# Patient Record
Sex: Female | Born: 1970
Health system: Southern US, Community
[De-identification: ages and names within clinical notes are randomized; demographics above are authoritative.]

## PROBLEM LIST (undated history)

## (undated) DIAGNOSIS — N939 Abnormal uterine and vaginal bleeding, unspecified: Secondary | ICD-10-CM

## (undated) DIAGNOSIS — G43109 Migraine with aura, not intractable, without status migrainosus: Secondary | ICD-10-CM

## (undated) DIAGNOSIS — D649 Anemia, unspecified: Secondary | ICD-10-CM

## (undated) DIAGNOSIS — E349 Endocrine disorder, unspecified: Secondary | ICD-10-CM

## (undated) DIAGNOSIS — F419 Anxiety disorder, unspecified: Secondary | ICD-10-CM

## (undated) DIAGNOSIS — K219 Gastro-esophageal reflux disease without esophagitis: Secondary | ICD-10-CM

## (undated) HISTORY — PX: TUBAL LIGATION: SHX77

## (undated) HISTORY — DX: Abnormal uterine and vaginal bleeding, unspecified: N93.9

## (undated) HISTORY — DX: Migraine with aura, not intractable, without status migrainosus: G43.109

## (undated) HISTORY — PX: OTHER SURGICAL HISTORY: SHX169

## (undated) HISTORY — DX: Anxiety disorder, unspecified: F41.9

## (undated) HISTORY — DX: Endocrine disorder, unspecified: E34.9

## (undated) HISTORY — PX: ABDOMINAL HYSTERECTOMY: SHX81

## (undated) HISTORY — DX: Anemia, unspecified: D64.9

---

## 2000-04-12 ENCOUNTER — Inpatient Hospital Stay (HOSPITAL_COMMUNITY): Admission: AD | Admit: 2000-04-12 | Discharge: 2000-04-12 | Payer: Self-pay | Admitting: *Deleted

## 2000-04-13 ENCOUNTER — Inpatient Hospital Stay (HOSPITAL_COMMUNITY): Admission: AD | Admit: 2000-04-13 | Discharge: 2000-04-13 | Payer: Self-pay | Admitting: Obstetrics

## 2003-07-15 ENCOUNTER — Emergency Department (HOSPITAL_COMMUNITY): Admission: EM | Admit: 2003-07-15 | Discharge: 2003-07-15 | Payer: Self-pay | Admitting: Emergency Medicine

## 2005-06-29 ENCOUNTER — Other Ambulatory Visit: Admission: RE | Admit: 2005-06-29 | Discharge: 2005-06-29 | Payer: Self-pay | Admitting: Obstetrics and Gynecology

## 2006-05-09 ENCOUNTER — Emergency Department (HOSPITAL_COMMUNITY): Admission: EM | Admit: 2006-05-09 | Discharge: 2006-05-09 | Payer: Self-pay | Admitting: Family Medicine

## 2010-07-21 ENCOUNTER — Emergency Department (HOSPITAL_COMMUNITY)
Admission: EM | Admit: 2010-07-21 | Discharge: 2010-07-21 | Payer: Self-pay | Source: Home / Self Care | Admitting: Emergency Medicine

## 2011-03-31 ENCOUNTER — Encounter (HOSPITAL_COMMUNITY): Payer: Self-pay

## 2011-03-31 ENCOUNTER — Encounter (HOSPITAL_COMMUNITY)
Admission: RE | Admit: 2011-03-31 | Discharge: 2011-03-31 | Disposition: A | Discharge status: Home / Self Care | Payer: Managed Care, Other (non HMO) | Source: Ambulatory Visit | Attending: Obstetrics and Gynecology | Admitting: Obstetrics and Gynecology

## 2011-03-31 HISTORY — DX: Gastro-esophageal reflux disease without esophagitis: K21.9

## 2011-03-31 LAB — CBC
HCT: 31 % — ABNORMAL LOW (ref 36.0–46.0)
MCV: 80.9 fL (ref 78.0–100.0)
RBC: 3.83 MIL/uL — ABNORMAL LOW (ref 3.87–5.11)
RDW: 15.9 % — ABNORMAL HIGH (ref 11.5–15.5)
WBC: 6 10*3/uL (ref 4.0–10.5)

## 2011-03-31 NOTE — Patient Instructions (Signed)
   Your procedure is scheduled on:Wed. 04/07/11  Enter through the Main Entrance of [facility] at:7am Pick up the phone at the desk and dial [phone] and inform us of your arrival  Please call this number if you have any problems the morning of surgery: [phone]  Remember: Do not eat food after midnight  Do not drink clear liquids after:midnight Take these medicines the morning of surgery with a SIP OF WATER:none  Do not wear jewelry, make-up, or FINGER nail polish Do not wear lotions, powders, or perfumes.  You may wear deodorant. Do not shave 48 hours prior to surgery. Do not bring valuables to the hospital. Leave suitcase in the car. After Surgery it may be brought to your room. For patients being admitted to the hospital, checkout time is 11:00am the day of discharge.  Patients discharged on the day of surgery will not be allowed to drive home.   Name and phone number of your driver: Annette Stable- [phone]   Remember to use your hibiclens as instructed.Please shower with 1/2 bottle the evening before your surgery and the other 1/2 bottle the morning of surgery.

## 2011-04-06 DIAGNOSIS — N926 Irregular menstruation, unspecified: Secondary | ICD-10-CM

## 2011-04-06 HISTORY — DX: Irregular menstruation, unspecified: N92.6

## 2011-04-06 NOTE — H&P (Signed)
Tammy Colon is an 40 y.o. female [phone]  With irr VB, on [place] fibroid uterus, question of polyp and submucosal fibroid.  Also with ASCUS + HR HPV pap, s/p colpo - benign.  D/w pt r/b/a of hysteroscopy, D&C.  Pertinent Gynecological History: Menstrual History: Pt with h/o abn pap, ASCUS HR HPV, benign colpo Also h/o BTL No h/o STDs, pt with multiple partners    Past Medical History  Diagnosis Date  . Sleep apnea     no sleep study done but does snore  . GERD (gastroesophageal reflux disease)     no meds  . Headache     Past Surgical History  Procedure Date  . Tubal ligation   D&E  Family history of Breast Cancer, Diabetes, and LUpus  Social History:  reports that she has been smoking Cigars.  She does not have any smokeless tobacco history on file. She reports that she drinks alcohol. She reports that she does not use illicit drugs. married  Allergies: No Known Allergies  Meds ibuprofen prn   Review of Systems  Constitutional: Negative.   HENT: Negative.   Eyes: Negative.   Respiratory: Negative.   Cardiovascular: Negative.   Gastrointestinal: Negative.   Genitourinary: Negative.   Musculoskeletal: Negative.   Skin: Negative.   Neurological: Negative.   Endo/Heme/Allergies: Negative.   Psychiatric/Behavioral: Negative.     AFVSS Physical Exam  Constitutional: She is oriented to person, place, and time. She appears well-developed and well-nourished.  HENT:  Head: Normocephalic and atraumatic.  Neck: Normal range of motion. Neck supple. No thyromegaly present.  Cardiovascular: Normal rate and regular rhythm.   Respiratory: Effort normal and breath sounds normal. No respiratory distress.  GI: Soft. Bowel sounds are normal. There is no tenderness.  Musculoskeletal: Normal range of motion.  Neurological: She is alert and oriented to person, place, and time.  Psychiatric: She has a normal mood and affect. Her behavior is normal.      Assessment/Plan: [name] with irregular menses, fibroids and ? Polyp seen on [place].  D/w pt r/b/a of surgery wishes to proceed. BOVARD,Arine Foley 04/06/2011, 5:35 PM

## 2011-04-07 ENCOUNTER — Encounter (HOSPITAL_COMMUNITY): Payer: Self-pay | Admitting: Anesthesiology

## 2011-04-07 ENCOUNTER — Encounter (HOSPITAL_COMMUNITY)
Admission: RE | Disposition: A | Discharge status: Home / Self Care | Payer: Self-pay | Source: Ambulatory Visit | Attending: Obstetrics and Gynecology

## 2011-04-07 ENCOUNTER — Ambulatory Visit (HOSPITAL_COMMUNITY)
Admission: RE | Admit: 2011-04-07 | Discharge: 2011-04-07 | Disposition: A | Discharge status: Home / Self Care | Payer: Managed Care, Other (non HMO) | Source: Ambulatory Visit | Attending: Obstetrics and Gynecology | Admitting: Obstetrics and Gynecology

## 2011-04-07 ENCOUNTER — Other Ambulatory Visit: Payer: Self-pay | Admitting: Obstetrics and Gynecology

## 2011-04-07 ENCOUNTER — Ambulatory Visit (HOSPITAL_COMMUNITY): Payer: Managed Care, Other (non HMO) | Admitting: Anesthesiology

## 2011-04-07 DIAGNOSIS — Z01818 Encounter for other preprocedural examination: Secondary | ICD-10-CM | POA: Insufficient documentation

## 2011-04-07 DIAGNOSIS — Z01812 Encounter for preprocedural laboratory examination: Secondary | ICD-10-CM | POA: Insufficient documentation

## 2011-04-07 DIAGNOSIS — N926 Irregular menstruation, unspecified: Secondary | ICD-10-CM

## 2011-04-07 DIAGNOSIS — D250 Submucous leiomyoma of uterus: Secondary | ICD-10-CM | POA: Insufficient documentation

## 2011-04-07 DIAGNOSIS — N840 Polyp of corpus uteri: Secondary | ICD-10-CM | POA: Insufficient documentation

## 2011-04-07 SURGERY — DILATATION & CURETTAGE/HYSTEROSCOPY WITH RESECTOCOPE
Anesthesia: General | Site: Vagina | Wound class: Clean Contaminated

## 2011-04-07 MED ORDER — LIDOCAINE HCL (CARDIAC) 20 MG/ML IV SOLN
INTRAVENOUS | Status: DC | PRN
  Administered 2011-04-07: 60 mg via INTRAVENOUS

## 2011-04-07 MED ORDER — FENTANYL CITRATE 0.05 MG/ML IJ SOLN
INTRAMUSCULAR | Status: AC
  Filled 2011-04-07: qty 2

## 2011-04-07 MED ORDER — PROPOFOL 10 MG/ML IV EMUL
INTRAVENOUS | Status: DC | PRN
  Administered 2011-04-07: 200 mg via INTRAVENOUS

## 2011-04-07 MED ORDER — IBUPROFEN 200 MG PO TABS: 800.0000 mg | ORAL_TABLET | Freq: Three times a day (TID) | ORAL | Status: DC | PRN

## 2011-04-07 MED ORDER — HYDROMORPHONE HCL 1 MG/ML IJ SOLN
INTRAMUSCULAR | Status: DC | PRN
  Administered 2011-04-07 (×2): 0.5 mg via INTRAVENOUS

## 2011-04-07 MED ORDER — PROPOFOL 10 MG/ML IV EMUL
INTRAVENOUS | Status: AC
  Filled 2011-04-07: qty 20

## 2011-04-07 MED ORDER — HYDROMORPHONE HCL 1 MG/ML IJ SOLN
INTRAMUSCULAR | Status: AC
  Filled 2011-04-07: qty 1

## 2011-04-07 MED ORDER — MIDAZOLAM HCL 5 MG/5ML IJ SOLN
INTRAMUSCULAR | Status: DC | PRN
  Administered 2011-04-07: 2 mg via INTRAVENOUS

## 2011-04-07 MED ORDER — LACTATED RINGERS IV SOLN
INTRAVENOUS | Status: DC
  Administered 2011-04-07 (×2): via INTRAVENOUS

## 2011-04-07 MED ORDER — CEFAZOLIN SODIUM 1-5 GM-% IV SOLN
1.0000 g | INTRAVENOUS | Status: AC
  Administered 2011-04-07: 1 g via INTRAVENOUS

## 2011-04-07 MED ORDER — MIDAZOLAM HCL 2 MG/2ML IJ SOLN
INTRAMUSCULAR | Status: AC
  Filled 2011-04-07: qty 2

## 2011-04-07 MED ORDER — DEXAMETHASONE SODIUM PHOSPHATE 10 MG/ML IJ SOLN
INTRAMUSCULAR | Status: AC
  Filled 2011-04-07: qty 1

## 2011-04-07 MED ORDER — KETOROLAC TROMETHAMINE 60 MG/2ML IM SOLN
INTRAMUSCULAR | Status: DC | PRN
  Administered 2011-04-07: 30 mg via INTRAMUSCULAR

## 2011-04-07 MED ORDER — LIDOCAINE HCL 1 % IJ SOLN
INTRAMUSCULAR | Status: DC | PRN
  Administered 2011-04-07: 17 mL

## 2011-04-07 MED ORDER — GLYCOPYRROLATE 0.2 MG/ML IJ SOLN
INTRAMUSCULAR | Status: DC | PRN
  Administered 2011-04-07: 0.1 mg via INTRAVENOUS

## 2011-04-07 MED ORDER — ONDANSETRON HCL 4 MG/2ML IJ SOLN
INTRAMUSCULAR | Status: DC | PRN
  Administered 2011-04-07: 4 mg via INTRAVENOUS

## 2011-04-07 MED ORDER — CEFAZOLIN SODIUM 1-5 GM-% IV SOLN
INTRAVENOUS | Status: AC
  Filled 2011-04-07: qty 50

## 2011-04-07 MED ORDER — FENTANYL CITRATE 0.05 MG/ML IJ SOLN: 25.0000 ug | INTRAMUSCULAR | Status: DC | PRN

## 2011-04-07 MED ORDER — KETOROLAC TROMETHAMINE 30 MG/ML IJ SOLN
INTRAMUSCULAR | Status: DC | PRN
  Administered 2011-04-07: 30 mg via INTRAVENOUS

## 2011-04-07 MED ORDER — GLYCOPYRROLATE 0.2 MG/ML IJ SOLN
INTRAMUSCULAR | Status: AC
  Filled 2011-04-07: qty 1

## 2011-04-07 MED ORDER — GLYCINE 1.5 % IR SOLN
Status: DC | PRN
  Administered 2011-04-07: 3000 mL

## 2011-04-07 MED ORDER — HYDROCODONE-ACETAMINOPHEN 5-500 MG PO TABS: 1.0000 | ORAL_TABLET | Freq: Four times a day (QID) | ORAL | Status: AC | PRN

## 2011-04-07 MED ORDER — ONDANSETRON HCL 4 MG/2ML IJ SOLN
INTRAMUSCULAR | Status: AC
  Filled 2011-04-07: qty 2

## 2011-04-07 MED ORDER — DEXAMETHASONE SODIUM PHOSPHATE 4 MG/ML IJ SOLN
INTRAMUSCULAR | Status: DC | PRN
  Administered 2011-04-07: 10 mg via INTRAVENOUS

## 2011-04-07 MED ORDER — FENTANYL CITRATE 0.05 MG/ML IJ SOLN
INTRAMUSCULAR | Status: DC | PRN
  Administered 2011-04-07: 100 ug via INTRAVENOUS

## 2011-04-07 MED ORDER — LIDOCAINE HCL (CARDIAC) 20 MG/ML IV SOLN
INTRAVENOUS | Status: AC
  Filled 2011-04-07: qty 5

## 2011-04-07 MED ORDER — KETOROLAC TROMETHAMINE 60 MG/2ML IM SOLN
INTRAMUSCULAR | Status: AC
  Filled 2011-04-07: qty 2

## 2011-04-07 SURGICAL SUPPLY — 15 items
ABLATOR ENDOMETRIAL BIPOLAR (ABLATOR) IMPLANT
CANISTER SUCTION 2500CC (MISCELLANEOUS) ×2 IMPLANT
CATH ROBINSON RED A/P 16FR (CATHETERS) ×2 IMPLANT
CLOTH BEACON ORANGE TIMEOUT ST (SAFETY) ×2 IMPLANT
CONTAINER PREFILL 10% NBF 60ML (FORM) ×4 IMPLANT
DRAPE UTILITY XL STRL (DRAPES) ×4 IMPLANT
ELECT REM PT RETURN 9FT ADLT (ELECTROSURGICAL) ×2
ELECTRODE REM PT RTRN 9FT ADLT (ELECTROSURGICAL) ×1 IMPLANT
GLOVE BIO SURGEON STRL SZ 6.5 (GLOVE) ×2 IMPLANT
GLOVE BIO SURGEON STRL SZ7 (GLOVE) ×2 IMPLANT
GOWN PREVENTION PLUS LG XLONG (DISPOSABLE) ×4 IMPLANT
LOOP ANGLED CUTTING 22FR (CUTTING LOOP) IMPLANT
PACK HYSTEROSCOPY LF (CUSTOM PROCEDURE TRAY) ×2 IMPLANT
TOWEL OR 17X24 6PK STRL BLUE (TOWEL DISPOSABLE) ×4 IMPLANT
WATER STERILE IRR 1000ML POUR (IV SOLUTION) ×2 IMPLANT

## 2011-04-07 NOTE — Anesthesia Procedure Notes (Signed)
Procedure Name: LMA Insertion Date/Time: 04/07/2011 8:46 AM Performed by: Karleen Dolphin Pre-anesthesia Checklist: Patient identified, Timeout performed, Emergency Drugs available, Suction available and Patient being monitored Patient Re-evaluated:Patient Re-evaluated prior to inductionOxygen Delivery Method: Circle System Utilized Preoxygenation: Pre-oxygenation with 100% oxygen Intubation Type: IV induction Ventilation: Mask ventilation without difficulty LMA: LMA inserted LMA Size: 4.0 Number of attempts: 1 Placement Confirmation: positive ETCO2 and breath sounds checked- equal and bilateral Dental Injury: Teeth and Oropharynx as per pre-operative assessment

## 2011-04-07 NOTE — Anesthesia Preprocedure Evaluation (Addendum)
Anesthesia Evaluation  Name, MR# and DOB Patient awake  General Assessment Comment  Reviewed: Allergy & Precautions, H&P , NPO status , Patient's Chart, lab work & pertinent test results  Airway Mallampati: I TM Distance: >3 FB Neck ROM: Full    Dental No notable dental hx.    Pulmonary    Pulmonary exam normal       Cardiovascular     Neuro/Psych    GI/Hepatic Neg liver ROS    Endo/Other  Negative Endocrine ROS  Renal/GU negative Renal ROS  Genitourinary negative   Musculoskeletal negative musculoskeletal ROS (+)   Abdominal Normal abdominal exam  (+)   Peds negative pediatric ROS (+)  Hematology negative hematology ROS (+)   Anesthesia Other Findings   Reproductive/Obstetrics negative OB ROS                           Anesthesia Physical Anesthesia Plan  ASA: II  Anesthesia Plan: General   Post-op Pain Management:    Induction: Intravenous  Airway Management Planned: LMA  Additional Equipment:   Intra-op Plan:   Post-operative Plan:   Informed Consent: I have reviewed the patients History and Physical, chart, labs and discussed the procedure including the risks, benefits and alternatives for the proposed anesthesia with the patient or authorized representative who has indicated his/her understanding and acceptance.     Plan Discussed with: CRNA  Anesthesia Plan Comments:         Anesthesia Quick Evaluation

## 2011-04-07 NOTE — Transfer of Care (Signed)
Immediate Anesthesia Transfer of Care Note  Patient: Tammy Colon  Procedure(s) Performed:  DILATATION & CURETTAGE/HYSTEROSCOPY WITH RESECTOSCOPE  Patient Location: PACU  Anesthesia Type: General  Level of Consciousness: awake, alert  and oriented  Airway & Oxygen Therapy: Patient Spontanous Breathing and Patient connected to nasal cannula oxygen  Post-op Assessment: Report given to PACU RN and Post -op Vital signs reviewed and stable  Post vital signs: Reviewed and stable  Complications: No apparent anesthesia complications

## 2011-04-07 NOTE — Anesthesia Postprocedure Evaluation (Signed)
  Anesthesia Post-op Note  Patient: Tammy Colon  Procedure(s) Performed:  DILATATION & CURETTAGE/HYSTEROSCOPY WITH RESECTOSCOPE  Patient is awake and responsive. Pain and nausea are reasonably well controlled. Vital signs are stable and clinically acceptable. Pt will follow up DBP with LMD. Oxygen saturation is clinically acceptable. There are no apparent anesthetic complications at this time. Patient is ready for discharge.

## 2011-04-07 NOTE — Brief Op Note (Signed)
04/07/2011  9:46 AM  PATIENT:  Tammy Colon  40 y.o. female  PRE-OPERATIVE DIAGNOSIS:  polyps, irregular vaginal bleeding  POST-OPERATIVE DIAGNOSIS:  Polyps, submucosal fibroid, irregular vaginal bleeding  PROCEDURE:  Procedure(s): DILATATION AND CURETTAGE (D&C) /HYSTEROSCOPY, polypectomy, myomectomy  SURGEON:  Surgeon(s): Sherron Monday, MD  ANESTHESIA:   local (17cc 1% lidocaine) and general  EBL:  Total I/O In: 1000 [I.V.:1000] Out: 350 [Urine:300; Blood:50]  LOCAL MEDICATIONS USED:  XYLOCAINE 17CC  SPECIMEN:  Source of Specimen:  endometrial currettings, polyps, fibroid  DISPOSITION OF SPECIMEN:  PATHOLOGY  COUNTS:  YES  DICTATION: .Other Dictation: Dictation Number [id number]  PLAN OF CARE: Discharge to home after PACU  PATIENT DISPOSITION:  PACU - hemodynamically stable.   Delay start of Pharmacological VTE agent (>24hrs) due to surgical blood loss or risk of bleeding:  not applicable
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
## 2013-03-06 ENCOUNTER — Ambulatory Visit (HOSPITAL_COMMUNITY): Payer: Managed Care, Other (non HMO) | Admitting: Psychiatry

## 2013-09-19 ENCOUNTER — Encounter (HOSPITAL_BASED_OUTPATIENT_CLINIC_OR_DEPARTMENT_OTHER): Payer: Self-pay | Admitting: Emergency Medicine

## 2013-09-19 ENCOUNTER — Emergency Department (HOSPITAL_BASED_OUTPATIENT_CLINIC_OR_DEPARTMENT_OTHER)
Admission: EM | Admit: 2013-09-19 | Discharge: 2013-09-19 | Disposition: A | Discharge status: Home / Self Care | Payer: Managed Care, Other (non HMO) | Attending: Emergency Medicine | Admitting: Emergency Medicine

## 2013-09-19 DIAGNOSIS — F17200 Nicotine dependence, unspecified, uncomplicated: Secondary | ICD-10-CM | POA: Diagnosis not present

## 2013-09-19 DIAGNOSIS — Y92410 Unspecified street and highway as the place of occurrence of the external cause: Secondary | ICD-10-CM | POA: Insufficient documentation

## 2013-09-19 DIAGNOSIS — S161XXA Strain of muscle, fascia and tendon at neck level, initial encounter: Secondary | ICD-10-CM

## 2013-09-19 DIAGNOSIS — S139XXA Sprain of joints and ligaments of unspecified parts of neck, initial encounter: Secondary | ICD-10-CM | POA: Insufficient documentation

## 2013-09-19 DIAGNOSIS — Z8719 Personal history of other diseases of the digestive system: Secondary | ICD-10-CM | POA: Diagnosis not present

## 2013-09-19 DIAGNOSIS — Y9389 Activity, other specified: Secondary | ICD-10-CM | POA: Diagnosis not present

## 2013-09-19 DIAGNOSIS — S0993XA Unspecified injury of face, initial encounter: Secondary | ICD-10-CM | POA: Diagnosis present

## 2013-09-19 MED ORDER — HYDROCODONE-ACETAMINOPHEN 5-325 MG PO TABS: 2.0000 | ORAL_TABLET | ORAL | Status: DC | PRN

## 2013-09-19 MED ORDER — CYCLOBENZAPRINE HCL 10 MG PO TABS: 10.0000 mg | ORAL_TABLET | Freq: Three times a day (TID) | ORAL | Status: DC | PRN

## 2013-09-19 MED ORDER — IBUPROFEN 800 MG PO TABS: 800.0000 mg | ORAL_TABLET | Freq: Three times a day (TID) | ORAL | Status: DC
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
## 2013-10-31 ENCOUNTER — Other Ambulatory Visit: Payer: Self-pay | Admitting: Obstetrics and Gynecology

## 2013-10-31 DIAGNOSIS — N6089 Other benign mammary dysplasias of unspecified breast: Secondary | ICD-10-CM

## 2013-11-13 ENCOUNTER — Ambulatory Visit
Admission: RE | Admit: 2013-11-13 | Discharge: 2013-11-13 | Disposition: A | Discharge status: Home / Self Care | Payer: Managed Care, Other (non HMO) | Source: Ambulatory Visit | Attending: Obstetrics and Gynecology | Admitting: Obstetrics and Gynecology

## 2013-11-13 ENCOUNTER — Encounter (INDEPENDENT_AMBULATORY_CARE_PROVIDER_SITE_OTHER): Payer: Self-pay

## 2013-11-13 ENCOUNTER — Other Ambulatory Visit: Payer: Self-pay | Admitting: Obstetrics and Gynecology

## 2013-11-13 DIAGNOSIS — N6089 Other benign mammary dysplasias of unspecified breast: Secondary | ICD-10-CM

## 2013-11-13 DIAGNOSIS — N6320 Unspecified lump in the left breast, unspecified quadrant: Secondary | ICD-10-CM

## 2015-05-18 ENCOUNTER — Encounter (HOSPITAL_COMMUNITY): Payer: Self-pay | Admitting: Emergency Medicine

## 2015-05-18 ENCOUNTER — Emergency Department (HOSPITAL_COMMUNITY)
Admission: EM | Admit: 2015-05-18 | Discharge: 2015-05-18 | Disposition: A | Discharge status: Home / Self Care | Payer: Managed Care, Other (non HMO) | Attending: Emergency Medicine | Admitting: Emergency Medicine

## 2015-05-18 DIAGNOSIS — F17210 Nicotine dependence, cigarettes, uncomplicated: Secondary | ICD-10-CM | POA: Insufficient documentation

## 2015-05-18 DIAGNOSIS — R51 Headache: Secondary | ICD-10-CM | POA: Diagnosis present

## 2015-05-18 DIAGNOSIS — Z791 Long term (current) use of non-steroidal anti-inflammatories (NSAID): Secondary | ICD-10-CM | POA: Insufficient documentation

## 2015-05-18 DIAGNOSIS — Z8669 Personal history of other diseases of the nervous system and sense organs: Secondary | ICD-10-CM | POA: Diagnosis not present

## 2015-05-18 DIAGNOSIS — Z8719 Personal history of other diseases of the digestive system: Secondary | ICD-10-CM | POA: Insufficient documentation

## 2015-05-18 DIAGNOSIS — R519 Headache, unspecified: Secondary | ICD-10-CM

## 2015-05-18 LAB — CBC WITH DIFFERENTIAL/PLATELET
BASOS PCT: 1 %
Basophils Absolute: 0 10*3/uL (ref 0.0–0.1)
Eosinophils Absolute: 0.2 10*3/uL (ref 0.0–0.7)
Eosinophils Relative: 4 %
HEMATOCRIT: 31.2 % — AB (ref 36.0–46.0)
HEMOGLOBIN: 9.7 g/dL — AB (ref 12.0–15.0)
LYMPHS ABS: 1.3 10*3/uL (ref 0.7–4.0)
LYMPHS PCT: 30 %
MCH: 27.1 pg (ref 26.0–34.0)
MCHC: 31.1 g/dL (ref 30.0–36.0)
MCV: 87.2 fL (ref 78.0–100.0)
MONOS PCT: 9 %
Monocytes Absolute: 0.4 10*3/uL (ref 0.1–1.0)
NEUTROS ABS: 2.4 10*3/uL (ref 1.7–7.7)
NEUTROS PCT: 56 %
Platelets: 343 10*3/uL (ref 150–400)
RBC: 3.58 MIL/uL — ABNORMAL LOW (ref 3.87–5.11)
RDW: 17.9 % — ABNORMAL HIGH (ref 11.5–15.5)
WBC: 4.2 10*3/uL (ref 4.0–10.5)

## 2015-05-18 LAB — BASIC METABOLIC PANEL
ANION GAP: 7 (ref 5–15)
BUN: 9 mg/dL (ref 6–20)
CALCIUM: 9.4 mg/dL (ref 8.9–10.3)
CO2: 24 mmol/L (ref 22–32)
Chloride: 107 mmol/L (ref 101–111)
Creatinine, Ser: 0.73 mg/dL (ref 0.44–1.00)
Glucose, Bld: 96 mg/dL (ref 65–99)
Potassium: 3.8 mmol/L (ref 3.5–5.1)
Sodium: 138 mmol/L (ref 135–145)

## 2015-05-18 MED ORDER — KETOROLAC TROMETHAMINE 30 MG/ML IJ SOLN
30.0000 mg | Freq: Once | INTRAMUSCULAR | Status: AC
  Administered 2015-05-18: 30 mg via INTRAVENOUS
  Filled 2015-05-18: qty 1

## 2015-05-18 MED ORDER — PROCHLORPERAZINE EDISYLATE 5 MG/ML IJ SOLN
10.0000 mg | Freq: Four times a day (QID) | INTRAMUSCULAR | Status: DC | PRN
  Administered 2015-05-18: 10 mg via INTRAVENOUS
  Filled 2015-05-18: qty 2

## 2015-05-18 MED ORDER — DIPHENHYDRAMINE HCL 50 MG/ML IJ SOLN
25.0000 mg | Freq: Once | INTRAMUSCULAR | Status: AC
  Administered 2015-05-18: 25 mg via INTRAVENOUS
  Filled 2015-05-18: qty 1

## 2015-05-18 MED ORDER — SODIUM CHLORIDE 0.9 % IV BOLUS (SEPSIS)
1000.0000 mL | Freq: Once | INTRAVENOUS | Status: AC
  Administered 2015-05-18: 1000 mL via INTRAVENOUS

## 2015-05-18 NOTE — Discharge Instructions (Signed)
You have been seen today for a headache and hypertension. Follow up with PCP within the next 2 days for diagnosis and chronic management of hypertension. Return to ED should symptoms worsen.   Emergency Department Resource Guide 1) Find a Doctor and Pay Out of Pocket Although you won't have to find out who is covered by your insurance plan, it is a good idea to ask around and get recommendations. You will then need to call the office and see if the doctor you have chosen will accept you as a new patient and what types of options they offer for patients who are self-pay. Some doctors offer discounts or will set up payment plans for their patients who do not have insurance, but you will need to ask so you aren't surprised when you get to your appointment.  2) Contact Your Local Health Department Not all health departments have doctors that can see patients for sick visits, but many do, so it is worth a call to see if yours does. If you don't know where your local health department is, you can check in your phone book. The CDC also has a tool to help you locate your state's health department, and many state websites also have listings of all of their local health departments.  3) Find a [facility] If your illness is not likely to be very severe or complicated, you may want to try a walk in clinic. These are popping up all over the country in pharmacies, drugstores, and shopping centers. They're usually staffed by nurse practitioners or physician assistants that have been trained to treat common illnesses and complaints. They're usually fairly quick and inexpensive. However, if you have serious medical issues or chronic medical problems, these are probably not your best option.  No Primary Care Doctor: - Call Health Connect at  [phone] - they can help you locate a primary care doctor that  accepts your insurance, provides certain services, etc. - Physician Referral Service- [phone]  Chronic  Pain Problems: Organization         Address  Phone   Notes  [facility]  [phone] Patients need to be referred by their primary care doctor.   Medication Assistance: Organization         Address  Phone   Notes  [facility] Medication [facility] [address] [place], [place] [address] [phone] --Must be a resident of [facility] -- Must have NO insurance coverage whatsoever (no Medicaid/ Medicare, etc.) -- The pt. MUST have a primary care doctor that directs their care regularly and follows them in the community   [facility]ist  [phone]   [employer]  [phone]    Agencies that provide inexpensive medical care: Organization         Address  Phone   Notes  [place]  [phone]   [facility]    [phone]   [facility] [place],  [phone]   [place] [address], [place] [phone]   Planned Parenthood    [phone]   [facility]    [phone]   [place] and [place] [address], [place] Phone:  [phone], Fax:  [phone] Hours of Operation:  9 am - 6 pm, M-F.  Also accepts Medicaid/Medicare and self-pay.  [facility]  [place] [employer], [address], [place] Phone: [phone], Fax: [phone]. Hours of Operation:  8:30 am - 5:30 pm, M-F.  Also accepts Medicaid and self-pay.  [facility] [place] [address], [place] Phone: [phone]   [place], [place], [place] [phone], Ext. [phone] Mondays & Thursdays: 7-9 AM.  First 15 patients are seen on a first come, first serve basis.    [place] Providers:  Organization         Address  Phone   Notes  [facility] [address], [place] [phone] Also  accepts self-pay patients.  [facility] [address], [place]  [phone]   [place], [address], [place] [phone]   [facility] [address], [place] [phone]   Lucianne Lei [address], [place]   [phone] Only accepts [place] Access [place] patients after they have their name applied to their card.   Self-Pay (no insurance) in [facility]:  Organization         Address  Phone   Notes  Sickle Cell Patients, [facility] [place] [phone]   [facility] Urgent Care [place] [phone]   [facility] [place]  [place], [place], [place] [phone]   [facility]/Dr. Osei-Bonsu  [address] [place] or [address], [place] [phone] Phone number for both [place] and [place] locations is the same.  Urgent Medical and [facility] [address], [place] [phone]   [facility] [address] [place] or [address] [phone]   [facility] [address] [place] [phone], phone; [phone], fax Sees patients 1st and 3rd Saturday of every month.  Must not qualify for public or private insurance (i.e. Medicaid, Medicare, [place] Health Choice, Veterans' Benefits)  Household income should be no more than 200% of the poverty level The clinic cannot treat you if you are pregnant or think you are pregnant  Sexually transmitted diseases are not treated at the clinic.    Dental Care: Organization         Address  Phone  Notes  [facility] Department of [facility] [place] [phone] Accepts children up to age 37 who are enrolled in [place] or [place]; pregnant  women with a Medicaid card; and children who have applied for Medicaid or [place] Health Choice, but were declined, whose parents can pay a reduced fee at time of service.  [facility] Department of [facility]  [address], [place] [phone] Accepts children up to age 58 who are enrolled in [place] or [place]; pregnant women with a Medicaid card; and children who have applied for Medicaid or [place] Health Choice, but were declined, whose parents can pay a reduced fee at time of service.  [facility] Adult [facility] Access PRO[facility]  [place] [phone] Patients are seen by appointment only. Walk-ins are not accepted. [place] will see patients 22 years of age and older. Monday - Tuesday (8am-5pm) Most Wednesdays (8:30-5pm) $30 per visit, cash only  [place] Adult  Dental Access PROGRAM  [address], [facility] [phone] Patients are seen by appointment only. Walk-ins are not accepted. [place] will see patients 60 years of age and older. One Wednesday Evening (Monthly: Volunteer Based).  $30 per visit, cash only  [place]  [phone] for adults; Children under age 4, call Graduate Pediatric Dentistry at [phone]. Children aged 49-14, please call [phone] to request a pediatric application.  Dental services are provided in all areas of dental care including fillings, crowns and bridges, complete and partial dentures, implants, gum treatment, root canals, and extractions. Preventive care is also provided. Treatment is provided to both adults and children. Patients are selected via a lottery and there is often a waiting list.   [facility] [address] [place]  [phone] [email or web address]   [facility] [address] [place], [place] [phone], Ext. [phone] Second and Fourth Thursday of each month, opens at 6:30 AM; Clinic ends at 9 AM.  Patients are  seen on a first-come first-served basis, and a limited number are seen during each clinic.   [facility]  [address] Hillard Danker [place], [place] [phone]   Eligibility Requirements You must have lived in [place], [place], or [place] counties for at least the last three months.   You cannot be eligible for state or federal sponsored [employer], including [employer], [place], or [employer].   You generally cannot be eligible for healthcare insurance through your employer.    How to apply: Eligibility screenings are held every Tuesday and Wednesday afternoon from 1:00 pm until 4:00 pm. You do not need an appointment for the interview!  [facility] [address] [place], [place]   [place]  [place] Department  [place]  [phone]    Behavioral Health Resources in the Community: Intensive Outpatient Programs Organization         Address  Phone  Notes  [facility] [place]. [address], [place], [place] [phone]   [facility] Outpatient [address], [place], [place]   ADS: Alcohol & Drug Svcs [address], [place], [place]   [place] [address],  [place], [place] or [phone]   Substance Abuse Resources Organization         Address  Phone  Notes  Alcohol and Drug Services  [phone]   [place]  [phone]   [facility] [place]   Chinita Pester  [phone]   Residential & Outpatient Substance Abuse Program  [phone]   Psychological Services Organization         Address  Phone  Notes  [facility] [place]  [place]  [phone]   [place] [address] [place] [phone] or [phone]    Mobile Crisis  Teams Organization         Address  Phone  Notes  [facility], Mobile Crisis Care Unit  [phone]   [facility]  [address] [place], [place]   [place] Levels [address] [place] [phone]    Self-Help/Support Groups Organization         Address  Phone             Notes  Mental  Health Assoc. of [place] - variety of support groups  [place] Call for more information  Narcotics Anonymous (NA), [facility] [address], [employer] [place]  2 meetings at this location   Special educational needs teacher         Address  Phone  Notes  [facility]idential [facility] [place],    [place]  [phone]   [facility]  [address], [place] [address], [place], [place]   [place], [place] [phone] Admissions: 8am-3pm M-F  Incentives Substance [place] [address],    [place], [place] [phone]   [facility] [address] [place], [place], [place]   [facility] [address]  [place], [place]   [facility] - Intensive Outpatient [address], [place], [place]   [facility] [place]  [place], [place] [phone] or [phone]   [facility]idential Treatment Services (RTS) [address] [place], [place] Accepts Medicaid  [facility] [place] [address],  [place] [phone] Substance Abuse/Addiction Treatment   [facility] Organization         Address  Phone  Notes  [facility]  [phone]   Domenic Schwab, PhD [address] [place], [place]   [phone] or [phone]   [place], [place] [phone]   [facility] [address], [place], [place] [phone]  Insurance/Medicaid/sponsorship through [facility] and Families [address] [place]                                    [place], [place] [phone] [place] [address], [place] [phone]    Dr. Adele Schilder  [phone]   [facility] Dept. 1) [address] [place] 2) [place] 3)  [place] [address], [place] [phone]  [phone]   [place] [phone] or [phone] (After Hours)

## 2015-05-18 NOTE — ED Provider Notes (Signed)
CSN: [id number]     Arrival date & time 05/18/15  1815 History   First MD Initiated Contact with Patient 05/18/15 1908     Chief Complaint  Patient presents with  . Headache     (Consider location/radiation/quality/duration/timing/severity/associated sxs/prior Treatment) HPI   Tammy Colon is a 44 y.o. female, with a history of headaches, presenting to the ED with a headache for the last three days. Pt states [facility]. Pt has no known history of hypertension. Headache is 5/10, on right side, sharp, radiating into the right side of her neck. Pt has tried ibuprofen with the last dose of 800 mg at about 1 pm. Pt states this only occasionally helps the pain. Pt adds that she used to drink 1 fifth of gin every day for at least the last 3 years, but stopped drinking altogether about a week ago. Pt denies changes in vision, LOC, dizziness, N/V, chest pain, shortness of breath, epistaxis, or any other pain or complaints.   Past Medical History  Diagnosis Date  . Sleep apnea     no sleep study done but does snore  . GERD (gastroesophageal reflux disease)     no meds  . [id number])    Past Surgical History  Procedure Laterality Date  . Tubal ligation    . Abdominal hysterectomy     History reviewed. No pertinent family history. Social History  Substance Use Topics  . Smoking status: Current Some Day Smoker    Types: Cigars  . Smokeless tobacco: None  . Alcohol Use: Yes     Comment: several drinks per week   OB History    No data available     Review of Systems  Gastrointestinal: Negative for nausea and vomiting.  Neurological: Positive for headaches. Negative for dizziness, syncope, weakness and numbness.  All other systems reviewed and are negative.     Allergies  Review of patient's allergies indicates no known allergies.  Home Medications   Prior to Admission medications   Medication Sig Start Date End Date Taking?  Authorizing Provider  cyclobenzaprine (FLEXERIL) 10 MG tablet Take 1 tablet (10 mg total) by mouth 3 (three) times daily as needed for muscle spasms. 09/19/13   Orpah Greek, MD  HYDROcodone-acetaminophen (NORCO/VICODIN) 5-325 MG per tablet Take 2 tablets by mouth every 4 (four) hours as needed for moderate pain. 09/19/13   Orpah Greek, MD  ibuprofen (ADVIL,MOTRIN) 200 MG tablet Take 4 tablets (800 mg total) by mouth every 8 (eight) hours as needed for pain. For headaches and cramps. 04/07/11   Janyth Contes, MD  ibuprofen (ADVIL,MOTRIN) 800 MG tablet Take 1 tablet (800 mg total) by mouth 3 (three) times daily. 09/19/13   Orpah Greek, MD   BP 155/103 mmHg  Pulse 81  Temp(Src) 98.9 F (37.2 C) (Oral)  Resp 18  SpO2 100%  LMP 05/14/2015 Physical Exam  Constitutional: She is oriented to person, place, and time. She appears well-developed and well-nourished. No distress.  HENT:  Head: Normocephalic and atraumatic.  Eyes: Conjunctivae and EOM are normal. Pupils are equal, round, and reactive to light.  Neck: Normal range of motion. Neck supple.  Cardiovascular: Normal rate, regular rhythm, normal heart sounds and intact distal pulses.   Pulmonary/Chest: Effort normal and breath sounds normal. No respiratory distress.  Abdominal: Soft. Bowel sounds are normal.  Musculoskeletal: Normal range of motion. She exhibits no edema or tenderness.  Lymphadenopathy:  She has no cervical adenopathy.  Neurological: She is alert and oriented to person, place, and time.  No sensory deficits. Strength 5/5 in all extremities. No gait disturbance. Cranial nerves II-XII grossly intact.  Skin: Skin is warm and dry. She is not diaphoretic.  Nursing note and vitals reviewed.   ED Course  Procedures (including critical care time) Labs Review Labs Reviewed  CBC WITH DIFFERENTIAL/PLATELET - Abnormal; Notable for the following:    RBC 3.58 (*)    Hemoglobin 9.7 (*)    HCT  31.2 (*)    RDW 17.9 (*)    All other components within normal limits  BASIC METABOLIC PANEL    Imaging Review No results found. I have personally reviewed and evaluated these images and lab results as part of my medical decision-making.   EKG Interpretation None      MDM   Final diagnoses:  Acute nonintractable headache, unspecified headache type    Cattleya A Colon presents with a headache and concern for hypertension.  Do not suspect hypertensive crisis or other major concern at this time. Patient's headache will be treated and patient discharged with instructions to follow-up with her PCP in the next few days. This plan of care was shared with the patient and she agrees to the plan. Upon reassessment patient states that her headache is gone and she feels completely normal. Patient is comfortable with discharge.    Lorayne Bender, PA-C 05/18/15 2050  Davonna Belling, MD 05/18/15 2328

## 2015-05-18 NOTE — ED Notes (Signed)
Pt c/o headache on R side, [facility] and [facility], no known hx of hypertension. OTC meds did not change headache.

## 2016-08-24 ENCOUNTER — Encounter: Payer: Self-pay | Admitting: Obstetrics and Gynecology

## 2016-08-24 ENCOUNTER — Ambulatory Visit (INDEPENDENT_AMBULATORY_CARE_PROVIDER_SITE_OTHER): Payer: 59 | Admitting: Obstetrics and Gynecology

## 2016-08-24 ENCOUNTER — Telehealth: Payer: Self-pay | Admitting: Obstetrics and Gynecology

## 2016-08-24 VITALS — BP 112/70 | HR 72 | Resp 14 | Ht 64.0 in | Wt 142.0 lb

## 2016-08-24 DIAGNOSIS — R102 Pelvic and perineal pain: Secondary | ICD-10-CM | POA: Diagnosis not present

## 2016-08-24 DIAGNOSIS — N946 Dysmenorrhea, unspecified: Secondary | ICD-10-CM | POA: Diagnosis not present

## 2016-08-24 DIAGNOSIS — Z124 Encounter for screening for malignant neoplasm of cervix: Secondary | ICD-10-CM | POA: Diagnosis not present

## 2016-08-24 DIAGNOSIS — Z113 Encounter for screening for infections with a predominantly sexual mode of transmission: Secondary | ICD-10-CM

## 2016-08-24 DIAGNOSIS — Z01419 Encounter for gynecological examination (general) (routine) without abnormal findings: Secondary | ICD-10-CM | POA: Diagnosis not present

## 2016-08-24 DIAGNOSIS — Z0000 Encounter for general adult medical examination without abnormal findings: Secondary | ICD-10-CM

## 2016-08-24 DIAGNOSIS — N941 Unspecified dyspareunia: Secondary | ICD-10-CM | POA: Diagnosis not present

## 2016-08-24 DIAGNOSIS — Z833 Family history of diabetes mellitus: Secondary | ICD-10-CM | POA: Diagnosis not present

## 2016-08-24 DIAGNOSIS — N920 Excessive and frequent menstruation with regular cycle: Secondary | ICD-10-CM

## 2016-08-24 DIAGNOSIS — Z23 Encounter for immunization: Secondary | ICD-10-CM | POA: Diagnosis not present

## 2016-08-24 LAB — COMPREHENSIVE METABOLIC PANEL WITH GFR
ALT: 8 U/L (ref 6–29)
AST: 12 U/L (ref 10–30)
Albumin: 4.1 g/dL (ref 3.6–5.1)
Alkaline Phosphatase: 52 U/L (ref 33–115)
BUN: 7 mg/dL (ref 7–25)
CO2: 25 mmol/L (ref 20–31)
Calcium: 9.7 mg/dL (ref 8.6–10.2)
Chloride: 103 mmol/L (ref 98–110)
Creat: 0.7 mg/dL (ref 0.50–1.10)
Glucose, Bld: 90 mg/dL (ref 65–99)
Potassium: 4.4 mmol/L (ref 3.5–5.3)
Sodium: 139 mmol/L (ref 135–146)
Total Bilirubin: 0.5 mg/dL (ref 0.2–1.2)
Total Protein: 7.5 g/dL (ref 6.1–8.1)

## 2016-08-24 LAB — CBC WITH DIFFERENTIAL/PLATELET
Basophils Absolute: 65 cells/uL (ref 0–200)
Basophils Relative: 1 %
EOS PCT: 2 %
Eosinophils Absolute: 130 cells/uL (ref 15–500)
HCT: 36.9 % (ref 35.0–45.0)
Hemoglobin: 12.4 g/dL (ref 11.7–15.5)
LYMPHS ABS: 1300 {cells}/uL (ref 850–3900)
Lymphocytes Relative: 20 %
MCH: 30.3 pg (ref 27.0–33.0)
MCHC: 33.6 g/dL (ref 32.0–36.0)
MCV: 90.2 fL (ref 80.0–100.0)
MPV: 9.4 fL (ref 7.5–12.5)
Monocytes Absolute: 715 cells/uL (ref 200–950)
Monocytes Relative: 11 %
NEUTROS ABS: 4290 {cells}/uL (ref 1500–7800)
Neutrophils Relative %: 66 %
Platelets: 408 10*3/uL — ABNORMAL HIGH (ref 140–400)
RBC: 4.09 MIL/uL (ref 3.80–5.10)
RDW: 13.5 % (ref 11.0–15.0)
WBC: 6.5 10*3/uL (ref 3.8–10.8)

## 2016-08-24 LAB — LIPID PANEL
Cholesterol: 200 mg/dL — ABNORMAL HIGH
HDL: 91 mg/dL
LDL Cholesterol: 92 mg/dL
Total CHOL/HDL Ratio: 2.2 ratio
Triglycerides: 85 mg/dL
VLDL: 17 mg/dL

## 2016-08-24 LAB — TSH: TSH: 0.76 m[IU]/L

## 2016-08-24 NOTE — Progress Notes (Addendum)
46 y.o. [id number] MarriedAfrican AmericanF here for annual exam. Patient c/o irregular menstrual cycles and LLQ pain with cycles  Cycles come monthly, q 21 to 28 days. Getting longer, now lasting 7-8 days (up from 3-5 in the last 1-2 years). Saturates a super tampon every 2 hours. Cramps are severe, she takes 800 mg of ibuprofen, can take it 4 hours apart, but only 2 x in a day. Sexually active, recently having some deep dyspareunia on the left side. It is positional. Since Saturday she c/o a constant sharp pain in her LLQ/pelvis. Helped with ASA but doesn't go away. Pain was up to a 10/10 on Saturday. Today 7-8/10, with ASA the pain resolves. Slight nausea, feeling hot flashes. Normal bowel and bladder functions.  She c/o hot flashes and night sweats for years. Up every night with sweats. Sleeps with a fan.  No vaginal dryness.  Period Duration (Days): 7-8 days  Period Pattern: (!) Irregular Menstrual Flow: Heavy Menstrual Control: Maxi pad, Tampon Menstrual Control Change Freq (Hours): changes tampon every 2 hours on heavy days  Dysmenorrhea: (!) Severe Dysmenorrhea Symptoms: Cramping  Patient's last menstrual period was 08/14/2016.          Sexually active: Yes.    The current method of family planning is tubal ligation.    Exercising: No.  The patient does not participate in regular exercise at present. Smoker:  Former   Health Maintenance: Pap:  07/2015 unsure results  History of abnormal Pap:  Yes, had a colposcopy over 7 years ago. No surgery on her cervix.  MMG:  11-13-13 WNL  Colonoscopy:  Never BMD:   Never TDaP:  unsure Gardasil: N/A   reports that she has quit smoking. Her smoking use included Cigars. She has never used smokeless tobacco. She reports that she drinks about 3.0 oz of alcohol per week . She reports that she does not use drugs. She works on [employer]. Kids are 25, 26, and 20. No one is at home.   Past Medical History:  Diagnosis Date  . Abnormal uterine bleeding    . Anemia   . GERD (gastroesophageal reflux disease)    no meds  . Hormone disorder   . Migraine with aura     Past Surgical History:  Procedure Laterality Date  . HYSTEROSCOPY    . TUBAL LIGATION      Current Outpatient Prescriptions  Medication Sig Dispense Refill  . ibuprofen (ADVIL,MOTRIN) 200 MG tablet Take 4 tablets (800 mg total) by mouth every 8 (eight) hours as needed for pain. For headaches and cramps. 40 tablet 1   No current facility-administered medications for this visit.     Family History  Problem Relation Age of Onset  . Thyroid disease Mother   . Lupus Maternal Aunt   . Cancer Maternal Aunt   . Diabetes Daughter   28 year old daughter has diabetes, juvenile   Review of Systems  Constitutional: Negative.   HENT: Negative.   Eyes: Negative.   Respiratory: Negative.   Cardiovascular: Negative.   Gastrointestinal: Negative.   Endocrine: Negative.   Genitourinary: Positive for menstrual problem.       Irregular menstrual cycles Menorrhea LLQ pain with cycles   Musculoskeletal: Negative.   Skin: Negative.   Allergic/Immunologic: Negative.   Neurological: Negative.   Psychiatric/Behavioral: Negative.     Exam:   BP 112/70 (BP Location: Right Arm, Patient Position: Sitting, Cuff Size: Normal)   Pulse 72   Resp 14   Ht 5'  4" (1.626 m)   Wt 142 lb (64.4 kg)   LMP 08/14/2016   BMI 24.37 kg/m   Weight change: @WEIGHTCHANGE @ Height:   Height: 5\' 4"  (162.6 cm)  Ht Readings from Last 3 Encounters:  08/24/16 5\' 4"  (1.626 m)  09/19/13 5\' 4"  (1.626 m)  03/31/11 5\' 3"  (1.6 m)    General appearance: alert, cooperative and appears stated age Head: Normocephalic, without obvious abnormality, atraumatic Neck: no adenopathy, supple, symmetrical, trachea midline and thyroid normal to inspection and palpation Lungs: clear to auscultation bilaterally Cardiovascular: regular rate and rhythm Breasts: normal appearance, no masses or tenderness, left nipple  inverted (long term) Heart: regular rate and rhythm Abdomen: soft, non-tender; bowel sounds normal; no masses,  no organomegaly Extremities: extremities normal, atraumatic, no cyanosis or edema Skin: Skin color, texture, turgor normal. No rashes or lesions Lymph nodes: Cervical, supraclavicular, and axillary nodes normal. No abnormal inguinal nodes palpated Neurologic: Grossly normal   Pelvic: External genitalia:  no lesions              Urethra:  normal appearing urethra with no masses, tenderness or lesions              Bartholins and Skenes: normal                 Vagina: normal appearing vagina with normal color and discharge, no lesions              Cervix: no cervical motion tenderness and no lesions               Bimanual Exam:  Uterus:  uterus anteverted, mobile, tender, most tender toward the right, uterus is enlarged 9 week sized. No adnexal masses, tender on the left.               Adnexa: tender in the left adnexa, no masses.                Rectovaginal: Confirms               Anus:  normal sphincter tone, no lesions  Chaperone was present for exam.  A:  Well Woman with normal exam  Menorrhagia  Dysmenorrhea  LLQ abdominal/pelvic pain  P:   Pap with hpv  Screening labs, TSH, Ferritin  GYN ultrasound, possible sonohysterogram  Not candidate for OCP's, and no help with the ring in the past  Discussed possible use of the Mirena IUD  Mammogram due, #'s given  TDAP  Discussed breast self exam  Discussed calcium and vit D intake  08/26/16: Records reviewed The patient has a h/o GC and Chlamydia H/O hysteroscopic myomectomy  Pap from 2/17 was negative with +HPV  This years pap is negative with negative hpv, she will need a f/u pap and hpv in 3 years.

## 2016-08-24 NOTE — Telephone Encounter (Signed)
Patient wants to know if she is to schedule the mammogram or are we setting it up for her.

## 2016-08-24 NOTE — Patient Instructions (Signed)

## 2016-08-24 NOTE — Telephone Encounter (Signed)
Spoke with patient. Advised patient since she is in need of a screening mammogram she may call and schedule this to ensure she is scheduled for a day and time that work well for her. Offered to assist with scheduling, but patient declines as she would like to check her work schedule before making an appointment.  Routing to provider for final review. Patient agreeable to disposition. Will close encounter.

## 2016-08-25 LAB — STD PANEL
HIV 1&2 Ab, 4th Generation: NONREACTIVE
Hepatitis B Surface Ag: NEGATIVE

## 2016-08-25 LAB — VITAMIN D 25 HYDROXY (VIT D DEFICIENCY, FRACTURES): VIT D 25 HYDROXY: 10 ng/mL — AB (ref 30–100)

## 2016-08-25 LAB — HEPATITIS C ANTIBODY: HCV Ab: NEGATIVE

## 2016-08-25 LAB — HEMOGLOBIN A1C
Hgb A1c MFr Bld: 4.9 % (ref <5.7)
Mean Plasma Glucose: 94 mg/dL

## 2016-08-25 LAB — IPS N GONORRHOEA AND CHLAMYDIA BY PCR

## 2016-08-25 LAB — FERRITIN: Ferritin: 67 ng/mL (ref 10–232)

## 2016-08-26 ENCOUNTER — Encounter: Payer: Self-pay | Admitting: Obstetrics and Gynecology

## 2016-08-26 ENCOUNTER — Telehealth: Payer: Self-pay | Admitting: *Deleted

## 2016-08-26 ENCOUNTER — Ambulatory Visit (INDEPENDENT_AMBULATORY_CARE_PROVIDER_SITE_OTHER): Payer: 59 | Admitting: Obstetrics and Gynecology

## 2016-08-26 ENCOUNTER — Ambulatory Visit (INDEPENDENT_AMBULATORY_CARE_PROVIDER_SITE_OTHER): Payer: 59

## 2016-08-26 ENCOUNTER — Other Ambulatory Visit: Payer: Self-pay | Admitting: Obstetrics and Gynecology

## 2016-08-26 VITALS — BP 108/88 | HR 72 | Resp 16 | Ht 64.0 in | Wt 142.0 lb

## 2016-08-26 DIAGNOSIS — N920 Excessive and frequent menstruation with regular cycle: Secondary | ICD-10-CM

## 2016-08-26 DIAGNOSIS — D252 Subserosal leiomyoma of uterus: Secondary | ICD-10-CM | POA: Diagnosis not present

## 2016-08-26 DIAGNOSIS — N949 Unspecified condition associated with female genital organs and menstrual cycle: Secondary | ICD-10-CM | POA: Diagnosis not present

## 2016-08-26 DIAGNOSIS — R102 Pelvic and perineal pain: Secondary | ICD-10-CM

## 2016-08-26 DIAGNOSIS — N946 Dysmenorrhea, unspecified: Secondary | ICD-10-CM

## 2016-08-26 DIAGNOSIS — D251 Intramural leiomyoma of uterus: Secondary | ICD-10-CM | POA: Diagnosis not present

## 2016-08-26 DIAGNOSIS — Z113 Encounter for screening for infections with a predominantly sexual mode of transmission: Secondary | ICD-10-CM | POA: Diagnosis not present

## 2016-08-26 DIAGNOSIS — D250 Submucous leiomyoma of uterus: Secondary | ICD-10-CM | POA: Diagnosis not present

## 2016-08-26 DIAGNOSIS — R1032 Left lower quadrant pain: Secondary | ICD-10-CM

## 2016-08-26 DIAGNOSIS — N9489 Other specified conditions associated with female genital organs and menstrual cycle: Secondary | ICD-10-CM

## 2016-08-26 LAB — IPS PAP TEST WITH HPV

## 2016-08-26 MED ORDER — OXYCODONE-ACETAMINOPHEN 5-325 MG PO TABS: 1.0000 | ORAL_TABLET | ORAL | 0 refills | Status: DC | PRN

## 2016-08-26 MED ORDER — VITAMIN D (ERGOCALCIFEROL) 1.25 MG (50000 UNIT) PO CAPS: 50000.0000 [IU] | ORAL_CAPSULE | ORAL | 0 refills | Status: DC

## 2016-08-26 MED ORDER — IBUPROFEN 800 MG PO TABS
800.0000 mg | ORAL_TABLET | Freq: Three times a day (TID) | ORAL | 1 refills | Status: DC | PRN
Start: 2016-08-26 — End: 2016-10-18

## 2016-08-26 NOTE — Telephone Encounter (Signed)
Spoke with patient and gave results. Sent in RX for Vitamin D. Patient scheduled got 12 week recheck -eh

## 2016-08-26 NOTE — Progress Notes (Signed)
Spoke with Prentiss Bells at [facility]. CT pelvis with contrast ordered for LLQ pain. Per Prentiss Bells, no available appointments for tomorrow. States if CT scan is precerted prior to arrival, patient will be able to be a walk in between the hours of 0830- 1630. Message given to Dr. Talbert Nan and patient. Patient will drink contrast at [facility]. Will have [place] or [place] call patient once precerted so patient will know when to go.

## 2016-08-26 NOTE — Progress Notes (Signed)
GYNECOLOGY  VISIT   HPI: 46 y.o.   Married  [place] American  female   [phone] with Patient's last menstrual period was 08/14/2016.   here for Pelvic US  For menorrhagia, dysmenorrhea, LLQ abdominal/pelvic pain with her cycles and  bad LLQ abdominal pain since the weekend. Labs from yesterday revealed a normal WBC and differential, negative genprobe and STD screening. Pap with +BV.  She denies fevers, chills. She has had mild constipation since yesterday.  The pain was severe last night, almost went to the ER.  She is here with her fiance Doren Custard. He states he is from [place] wonders if she was checked for trich. He is not aware of having it, but it is prevalent in [place]. She denies a watery d/c.    GYNECOLOGIC HISTORY: Patient's last menstrual period was 08/14/2016. Contraception: Tubal ligation  Menopausal hormone therapy: none        OB History    Gravida Para Term Preterm AB Living   4 3 2 1 1 3    SAB TAB Ectopic Multiple Live Births           3         Patient Active Problem List   Diagnosis Date Noted  . Irregular menses 04/06/2011    Past Medical History:  Diagnosis Date  . Abnormal uterine bleeding   . Anemia   . GERD (gastroesophageal reflux disease)    no meds  . Hormone disorder   . Migraine with aura     Past Surgical History:  Procedure Laterality Date  . HYSTEROSCOPY    . TUBAL LIGATION      Current Outpatient Prescriptions  Medication Sig Dispense Refill  . Vitamin D, Ergocalciferol, (DRISDOL) 50000 units CAPS capsule Take 1 capsule (50,000 Units total) by mouth every 7 (seven) days. 12 capsule 0  . ibuprofen (ADVIL,MOTRIN) 800 MG tablet Take 1 tablet (800 mg total) by mouth every 8 (eight) hours as needed. 30 tablet 1  . oxyCODONE-acetaminophen (PERCOCET) 5-325 MG tablet Take 1-2 tablets by mouth every 4 (four) hours as needed. use only as much as needed to relieve pain 20 tablet 0   No current facility-administered medications for this visit.       ALLERGIES: Patient has no known allergies.  Family History  Problem Relation Age of Onset  . Thyroid disease Mother   . Lupus Maternal Aunt   . Cancer Maternal Aunt   . Diabetes Daughter     Social History   Social History  . Marital status: Married    Spouse name: N/A  . Number of children: N/A  . Years of education: N/A   Occupational History  . Not on file.   Social History Main Topics  . Smoking status: Former Smoker    Types: Cigars  . Smokeless tobacco: Never Used  . Alcohol use 3.0 oz/week    5 Glasses of wine per week  . Drug use: No  . Sexual activity: Yes    Partners: Male    Birth control/ protection: Surgical   Other Topics Concern  . Not on file   Social History Narrative  . No narrative on file    Review of Systems  Constitutional: Negative.   HENT: Negative.   Eyes: Negative.   Respiratory: Negative.   Cardiovascular: Negative.   Gastrointestinal: Negative.   Genitourinary: Negative.   Musculoskeletal: Negative.   Skin: Negative.   Neurological: Negative.   Endo/Heme/Allergies: Negative.   Psychiatric/Behavioral: Negative.  PHYSICAL EXAMINATION:    BP 108/88 (BP Location: Right Arm, Patient Position: Sitting, Cuff Size: Large)   Pulse 72   Resp 16   Ht 5\' 4"  (1.626 m)   Wt 142 lb (64.4 kg)   LMP 08/14/2016   BMI 24.37 kg/m     General appearance: alert, cooperative and appears stated age  Abdomen: soft, very tender in the LLQ, no rebound, no guarding, mildly distended; no masses,  no organomegaly  Pelvic: External genitalia:  no lesions              Urethra:  normal appearing urethra with no masses, tenderness or lesions              Bartholins and Skenes: normal                 Cervix: no cervical motion tenderness              Bimanual Exam:  Uterus:  uterus anteverted, 9 week sized, tender, mobile              Adnexa: no masses, tender in the left adnexa   Ultrasound images reviewed with the patient and her fiance.  She has an anteverted uterus with several small myomas, including a submucosal myoma. The right ovary appeared normal. Next to the right ovary is a 2 cm mass (?tube). The left ovary with mass measures 5.2 x 3.2 x 3.4 cm, can't separate the left ovary from the mass. It has solid and cystic spaces, avascular, ? Tubular. Can't r/o abscess.                 ASSESSMENT -Menorrhagia, dysmenorrhea, she has a fibroid uterus with a submucosal myoma -LLQ abdominal/pelvic pain with her cycle, worsening pain in the last 5 days, she has an abnormal appearance to the left adnexa, concerning for possible abscess. She doesn't have an elevated WBC, cervical motion tenderness or an acute abdomen on exam. Negative genprobe yesterday. She has also had a tubal ligation which should decrease her risk of PID.  -Screening for STD's, negative from yesterday. Will send off a wet prep probe to r/o trich -BV on pap, she is starting Flagyl    PLAN -UPT negative -Will set her up for a pelvic CT tomorrow morning -Will send her home with ibuprofen and percocet -She will call with worsening pain, watch for fever, aware to go to the ER overnight if needed -Discussed the need to figure out what is going on in her left adnexal region prior to making further plans -She could have a minimum of a hysteroscopic myomectomy, but would prefer hysterectomy. Will further discuss at her f/u visit    An After Visit Summary was printed and given to the patient.  25 minutes face to face time of which over 50% was spent in counseling.

## 2016-08-26 NOTE — Telephone Encounter (Signed)
-----   Message from Salvadore Dom, MD sent at 08/25/2016  5:15 PM EST ----- Please inform the patient that her vit d level was very low and call in 50,000 IU of vit D3 weekly for 12 weeks, then she needs to come in for another vit d level.  Her pap is pending, but the rest of her lab work was normal.

## 2016-08-27 ENCOUNTER — Telehealth: Payer: Self-pay | Admitting: *Deleted

## 2016-08-27 ENCOUNTER — Ambulatory Visit
Admission: RE | Admit: 2016-08-27 | Discharge: 2016-08-27 | Disposition: A | Discharge status: Home / Self Care | Payer: 59 | Source: Ambulatory Visit | Attending: Obstetrics and Gynecology | Admitting: Obstetrics and Gynecology

## 2016-08-27 DIAGNOSIS — R1032 Left lower quadrant pain: Secondary | ICD-10-CM

## 2016-08-27 LAB — WET PREP BY MOLECULAR PROBE
Candida species: NOT DETECTED
GARDNERELLA VAGINALIS: DETECTED — AB
TRICHOMONAS VAG: NOT DETECTED

## 2016-08-27 MED ORDER — IOPAMIDOL (ISOVUE-300) INJECTION 61%
100.0000 mL | Freq: Once | INTRAVENOUS | Status: AC | PRN
  Administered 2016-08-27: 100 mL via INTRAVENOUS

## 2016-08-27 MED ORDER — METRONIDAZOLE 500 MG PO TABS: 500.0000 mg | ORAL_TABLET | Freq: Two times a day (BID) | ORAL | 0 refills | Status: DC

## 2016-08-27 NOTE — Telephone Encounter (Signed)
Spoke with patient. Advised patient precert done for CT, f/u with [facility], provided reference # [id number]. Patient asking is any money will be collected at [facility] for CT, advised pateint to call [facility] directly for payment questions, patient is agreeable. Patient states she was supposed to have 4 prescriptions from Dr. Talbert Nan on 3/2, but only received 3. Patient states she was to get RX for BV, prefers gel. Advised patient will confirm ok to send Metrogel to pharmacy and return call, patient is agreeable.   Dr. Talbert Nan, ok to send Metrogel to pharmacy?  Cc: Deloris Ping

## 2016-08-27 NOTE — Telephone Encounter (Signed)
Spoke with Diane at [facility], was advised to call report on CT of pelvis. Report available for review in EPIC. Advised will review with provider.   Dr. Talbert Nan, please review CT dated today and advise.

## 2016-08-27 NOTE — Telephone Encounter (Signed)
Spoke with patient. Advised of message and results as seen below from [place]. Patient is agreeable and verbalizes understanding. Rx for Flagyl 500 mg BID x 7 days sent to pharmacy on file. Follow up appointment scheduled for 08/31/2016 at 4:30 pm as patient is only able to come late in the afternoon with work.  Notes Recorded by Salvadore Dom, MD on 08/27/2016 at 11:39 AM EST Please let the patient know that her wet prep probe only returned + for BV. Her CT scan did not show an abscess in her pelvis. Please schedule her to come back for f/u early next week and we can discuss further plans.  Routing to provider for final review. Patient agreeable to disposition. Will close encounter.

## 2016-08-27 NOTE — Telephone Encounter (Signed)
See first telephone encounter dated 08/27/16, will close encounter.

## 2016-08-27 NOTE — Telephone Encounter (Signed)
Given her discomfort, I think the oral flagyl would be better for her. 500 mg po BID x 7 days.

## 2016-08-31 ENCOUNTER — Ambulatory Visit (INDEPENDENT_AMBULATORY_CARE_PROVIDER_SITE_OTHER): Payer: 59 | Admitting: Obstetrics and Gynecology

## 2016-08-31 ENCOUNTER — Encounter: Payer: Self-pay | Admitting: Obstetrics and Gynecology

## 2016-08-31 VITALS — BP 122/70 | HR 84 | Resp 14 | Wt 142.0 lb

## 2016-08-31 DIAGNOSIS — D252 Subserosal leiomyoma of uterus: Secondary | ICD-10-CM | POA: Diagnosis not present

## 2016-08-31 DIAGNOSIS — N920 Excessive and frequent menstruation with regular cycle: Secondary | ICD-10-CM

## 2016-08-31 DIAGNOSIS — D251 Intramural leiomyoma of uterus: Secondary | ICD-10-CM | POA: Diagnosis not present

## 2016-08-31 DIAGNOSIS — N946 Dysmenorrhea, unspecified: Secondary | ICD-10-CM | POA: Diagnosis not present

## 2016-08-31 DIAGNOSIS — D250 Submucous leiomyoma of uterus: Secondary | ICD-10-CM

## 2016-08-31 NOTE — Progress Notes (Signed)
GYNECOLOGY  VISIT   HPI: 46 y.o.   Married  [place] American  female   [phone] with Patient's last menstrual period was 08/14/2016.   here for follow up LLQ pain    GYNECOLOGIC HISTORY: Patient's last menstrual period was 08/14/2016. Contraception:tubal ligation Menopausal hormone therapy: none         OB History    Gravida Para Term Preterm AB Living   4 3 2 1 1 3    SAB TAB Ectopic Multiple Live Births           3         Patient Active Problem List   Diagnosis Date Noted  . Irregular menses 04/06/2011    Past Medical History:  Diagnosis Date  . Abnormal uterine bleeding   . Anemia   . GERD (gastroesophageal reflux disease)    no meds  . Hormone disorder   . Migraine with aura     Past Surgical History:  Procedure Laterality Date  . hysteroscopic myomectomy    . TUBAL LIGATION      Current Outpatient Prescriptions  Medication Sig Dispense Refill  . ibuprofen (ADVIL,MOTRIN) 800 MG tablet Take 1 tablet (800 mg total) by mouth every 8 (eight) hours as needed. 30 tablet 1  . metroNIDAZOLE (FLAGYL) 500 MG tablet Take 1 tablet (500 mg total) by mouth 2 (two) times daily. 14 tablet 0  . oxyCODONE-acetaminophen (PERCOCET) 5-325 MG tablet Take 1-2 tablets by mouth every 4 (four) hours as needed. use only as much as needed to relieve pain 20 tablet 0  . Vitamin D, Ergocalciferol, (DRISDOL) 50000 units CAPS capsule Take 1 capsule (50,000 Units total) by mouth every 7 (seven) days. 12 capsule 0   No current facility-administered medications for this visit.      ALLERGIES: Patient has no known allergies.  Family History  Problem Relation Age of Onset  . Thyroid disease Mother   . Lupus Maternal Aunt   . Cancer Maternal Aunt   . Diabetes Daughter     Social History   Social History  . Marital status: Married    Spouse name: N/A  . Number of children: N/A  . Years of education: N/A   Occupational History  . Not on file.   Social History Main Topics  .  Smoking status: Former Smoker    Types: Cigars  . Smokeless tobacco: Never Used  . Alcohol use 3.0 oz/week    5 Glasses of wine per week  . Drug use: No  . Sexual activity: Yes    Partners: Male    Birth control/ protection: Surgical   Other Topics Concern  . Not on file   Social History Narrative  . No narrative on file    Review of Systems  Constitutional: Negative.   HENT: Negative.   Eyes: Negative.   Respiratory: Negative.   Cardiovascular: Negative.   Gastrointestinal: Negative.   Genitourinary:       LLQ pain  Musculoskeletal: Negative.   Skin: Negative.   Neurological: Negative.   Endo/Heme/Allergies: Negative.   Psychiatric/Behavioral: Negative.     PHYSICAL EXAMINATION:    Wt 142 lb (64.4 kg)   LMP 08/14/2016   BMI 24.37 kg/m     General appearance: alert, cooperative and appears stated age   Pelvic: External genitalia:  no lesions              Urethra:  normal appearing urethra with no masses, tenderness or lesions  Bartholins and Skenes: normal                 Vagina: normal appearing vagina with normal color and discharge, no lesions              Cervix: no lesions                The risks of endometrial biopsy were reviewed and a consent was obtained.  A speculum was placed in the vagina and the cervix was cleansed with betadine. A tenaculum was placed on the cervix and the mini-pipelle was placed into the endometrial cavity. The uterus sounded to 9 cm. The endometrial biopsy was performed, moderate tissue was obtained. The tenaculum and speculum were removed. There were no complications.   Chaperone was present for exam.  ASSESSMENT Menorrhagia Dysmenorrhea Fibroid uterus, including a submucous myoma LLQ abdominal pain, improved today, limited ultrasound adnexa improved     PLAN Reviewed options of hysteroscopic myomectomy, contraception, IUD's or hysterectomy She desires hysterectomy Discussed total laparoscopic  hysterectomy, bilateral salpingectomy and cystoscopy. Reviewed the risks of the procedure, including infection, bleeding, damage to bowel/badder/vessels/ureters.  Discussed the possible need for laparotomy. Discussed post operative recovery and risk of cuff dehiscence. All of her questions were answered    An After Visit Summary was printed and given to the patient.  20 minutes face to face time of which over 50% was spent in counseling.

## 2016-09-02 ENCOUNTER — Telehealth: Payer: Self-pay | Admitting: Obstetrics and Gynecology

## 2016-09-02 NOTE — Telephone Encounter (Signed)
Attempted to reach patient at number provided [phone], recording states the voicemail box you have reached has not been set up yet.   Need to advise patient results have not returned yet and she will be contacted as soon as they are available.

## 2016-09-02 NOTE — Telephone Encounter (Signed)
Patient called requesting her recent biopsy results. She said, "I may need surgery but I need to know what my results are first." Patient aware results may not be ready for her yet.

## 2016-09-02 NOTE — Telephone Encounter (Signed)
Spoke with patient. Advised results from [facility] have not returned yet and that this is not uncommon as they take longer to return. Advised as soon as results have returned Dr.Jertson will review these and our office will be in contact with her to discuss results and next steps in plan of care. Patient verbalizes understanding.  Routing to provider for final review. Patient agreeable to disposition. Will close encounter.

## 2016-09-03 ENCOUNTER — Telehealth: Payer: Self-pay | Admitting: *Deleted

## 2016-09-03 NOTE — Telephone Encounter (Signed)
Call to patient to discuss surgery date options. Patient has had increased pain today from yesterday, requiring Motrin. Encouraged to be sure to eat when taking this. She is considering options and will assess pain over weekend. First available date of 09-13-16 is discussed but patient thinks 09-20-16 will work better. She will call back on Monday.

## 2016-09-09 ENCOUNTER — Telehealth: Payer: Self-pay | Admitting: Obstetrics and Gynecology

## 2016-09-09 NOTE — Telephone Encounter (Signed)
See alternate phone message from 09-03-16.  Called patient today to review surgery instructions unaware of this message. Advised usual time out of work for this surgery is 4-6 weeks. 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  Advised Dr Talbert Nan will review and make determination regarding time out of work. Patient stated she needed me to call back after work because "now I am just frustrated" and disconnected call.

## 2016-09-09 NOTE — Telephone Encounter (Signed)
Patient having surgery on 09/20/16.  Her disability representative is giving her 6 weeks but if Dr Talbert Nan wants her to have longer that is fine.  They should be sending the forms over any day now.  If you have any questions she said please feel free to call her.

## 2016-09-09 NOTE — Telephone Encounter (Signed)
Call to patient to confirm surgery information and review surgery instructions. Patient asking about message she left earlier today (see alternate phone note). She spoke to representative at [facility] who advised her she could have 6 weeks out of work for hysterectomy. Advised patient generally this procedure is 4 weeks sometimes up to 6 depending on recovery.  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 Advised I will give message to Dr Talbert Nan and check with her regarding recommended time out of work.  Patient then states they have "already written her out for 6 weeks and it is not costing anyone anything."  Advised again will give message to Dr Talbert Nan and see what she says, I am just calling to review surgery instructions.  Patient then states she needs me to call her back after work because she "now I am just frustrated." and disconnects call.  Routing to provider for review.

## 2016-09-13 NOTE — Patient Instructions (Signed)
Your procedure is scheduled on:  Monday, September 20, 2016  Enter through the Main Entrance of [facility] at:  6:00 AM  Pick up the phone at the desk and dial [phone].  Call this number if you have problems the morning of surgery: [phone].  Remember: Do NOT eat food or drink after:  Midnight Sunday  Take these medicines the morning of surgery with a SIP OF WATER:  None  Stop ALL herbal medications and Ibuprofen at this time  Do NOT smoke the day of surgery.  Do NOT wear jewelry (body piercing), metal hair clips/bobby pins, make-up, or nail polish. Do NOT wear lotions, powders, or perfumes.  You may wear deodorant. Do NOT shave for 48 hours prior to surgery. Do NOT bring valuables to the hospital. Contacts, dentures, or bridgework may not be worn into surgery.  Leave suitcase in car.  After surgery it may be brought to your room.  For patients admitted to the hospital, checkout time is 11:00 AM the day of discharge.  Bring a copy of your healthcare power of attorney and living will documents.

## 2016-09-13 NOTE — H&P (Signed)
close encounter    46 y.o. [id number] MarriedAfrican AmericanF here for annual exam. Patient c/o irregular menstrual cycles and LLQ pain with cycles  Cycles come monthly, q 21 to 28 days. Getting longer, now lasting 7-8 days (up from 3-5 in the last 1-2 years). Saturates a super tampon every 2 hours. Cramps are severe, she takes 800 mg of ibuprofen, can take it 4 hours apart, but only 2 x in a day. Sexually active, recently having some deep dyspareunia on the left side. It is positional. Since Saturday she c/o a constant sharp pain in her LLQ/pelvis. Helped with ASA but doesn't go away. Pain was up to a 10/10 on Saturday. Today 7-8/10, with ASA the pain resolves. Slight nausea, feeling hot flashes. Normal bowel and bladder functions.  She c/o hot flashes and night sweats for years. Up every night with sweats. Sleeps with a fan.  No vaginal dryness.  Period Duration (Days): 7-8 days  Period Pattern: (!) Irregular Menstrual Flow: Heavy Menstrual Control: Maxi pad, Tampon Menstrual Control Change Freq (Hours): changes tampon every 2 hours on heavy days  Dysmenorrhea: (!) Severe Dysmenorrhea Symptoms: Cramping  Patient's last menstrual period was 08/14/2016.          Sexually active: Yes.    The current method of family planning is tubal ligation.    Exercising: No.  The patient does not participate in regular exercise at present. Smoker:  Former   Health Maintenance: Pap:  07/2015 unsure results  History of abnormal Pap:  Yes, had a colposcopy over 7 years ago. No surgery on her cervix.  MMG:  11-13-13 WNL  Colonoscopy:  Never BMD:   Never TDaP:  unsure Gardasil: N/A   reports that she has quit smoking. Her smoking use included Cigars. She has never used smokeless tobacco. She reports that she drinks about 3.0 oz of alcohol per week . She reports that she does not use drugs. She works on [employer]. Kids are 25, 26, and 20. No one is at home.       Past Medical History:  Diagnosis Date   . Abnormal uterine bleeding   . Anemia   . GERD (gastroesophageal reflux disease)    no meds  . Hormone disorder   . Migraine with aura          Past Surgical History:  Procedure Laterality Date  . HYSTEROSCOPY    . TUBAL LIGATION            Current Outpatient Prescriptions  Medication Sig Dispense Refill  . ibuprofen (ADVIL,MOTRIN) 200 MG tablet Take 4 tablets (800 mg total) by mouth every 8 (eight) hours as needed for pain. For headaches and cramps. 40 tablet 1   No current facility-administered medications for this visit.          Family History  Problem Relation Age of Onset  . Thyroid disease Mother   . Lupus Maternal Aunt   . Cancer Maternal Aunt   . Diabetes Daughter   42 year old daughter has diabetes, juvenile   Review of Systems  Constitutional: Negative.   HENT: Negative.   Eyes: Negative.   Respiratory: Negative.   Cardiovascular: Negative.   Gastrointestinal: Negative.   Endocrine: Negative.   Genitourinary: Positive for menstrual problem.       Irregular menstrual cycles Menorrhea LLQ pain with cycles   Musculoskeletal: Negative.   Skin: Negative.   Allergic/Immunologic: Negative.   Neurological: Negative.   Psychiatric/Behavioral: Negative.     Exam:  BP 112/70 (BP Location: Right Arm, Patient Position: Sitting, Cuff Size: Normal)   Pulse 72   Resp 14   Ht 5\' 4"  (1.626 m)   Wt 142 lb (64.4 kg)   LMP 08/14/2016   BMI 24.37 kg/m   Weight change: @WEIGHTCHANGE @ Height:   Height: 5\' 4"  (162.6 cm)  Ht Readings from Last 3 Encounters:  08/24/16 5\' 4"  (1.626 m)  09/19/13 5\' 4"  (1.626 m)  03/31/11 5\' 3"  (1.6 m)    General appearance: alert, cooperative and appears stated age Head: Normocephalic, without obvious abnormality, atraumatic Neck: no adenopathy, supple, symmetrical, trachea midline and thyroid normal to inspection and palpation Lungs: clear to auscultation bilaterally Cardiovascular: regular rate and  rhythm Breasts: normal appearance, no masses or tenderness, left nipple inverted (long term) Heart: regular rate and rhythm Abdomen: soft, non-tender; bowel sounds normal; no masses,  no organomegaly Extremities: extremities normal, atraumatic, no cyanosis or edema Skin: Skin color, texture, turgor normal. No rashes or lesions Lymph nodes: Cervical, supraclavicular, and axillary nodes normal. No abnormal inguinal nodes palpated Neurologic: Grossly normal   Pelvic: External genitalia:  no lesions              Urethra:  normal appearing urethra with no masses, tenderness or lesions              Bartholins and Skenes: normal                 Vagina: normal appearing vagina with normal color and discharge, no lesions              Cervix: no cervical motion tenderness and no lesions               Bimanual Exam:  Uterus:  uterus anteverted, mobile, tender, most tender toward the right, uterus is enlarged 9 week sized. No adnexal masses, tender on the left.               Adnexa: tender in the left adnexa, no masses.                Rectovaginal: Confirms               Anus:  normal sphincter tone, no lesions  Chaperone was present for exam.  A:         Well Woman with normal exam             Menorrhagia             Dysmenorrhea             LLQ abdominal/pelvic pain  P:         Pap with hpv             Screening labs, TSH, Ferritin             GYN ultrasound, possible sonohysterogram             Not candidate for OCP's, and no help with the ring in the past             Discussed possible use of the Mirena IUD             Mammogram due, #'s given             TDAP             Discussed breast self exam             Discussed calcium and vit D intake  08/26/16: Records  reviewed The patient has a h/o GC and Chlamydia H/O hysteroscopic myomectomy  Pap from 2/17 was negative with +HPV      Addendum: pap negative with negative hpv. Negative STD testing  U/S from 08/26/16: Ultrasound  images reviewed with the patient and her fiance. She has an anteverted uterus with several small myomas, including a submucosal myoma. The right ovary appeared normal. Next to the right ovary is a 2 cm mass (?tube). The left ovary with mass measures 5.2 x 3.2 x 3.4 cm, can't separate the left ovary from the mass. It has solid and cystic spaces, avascular, ? Tubular. Can't r/o abscess  Normal CBC  CT from 08/27/16 without abscess  08/31/16 Endometrial biopsy: benign secretory endometrium  On 08/31/16: Reviewed options of hysteroscopic myomectomy, contraception, IUD's or hysterectomy She desires hysterectomy Discussed total laparoscopic hysterectomy, bilateral salpingectomy and cystoscopy. Reviewed the risks of the procedure, including infection, bleeding, damage to bowel/badder/vessels/ureters.  Discussed the possible need for laparotomy. Discussed post operative recovery and risk of cuff dehiscence. All of her questions were answered

## 2016-09-13 NOTE — Telephone Encounter (Signed)
Call to patient to review pre op instructions. Patient advised that although it is only medically necessary for 4 weeks out of work post op, since she had arranged with her employer to be out 6 weeks, Dr. Talbert Nan would write her out for 6 weeks. Patient states, "I was not the one who arranged to be out for 6 weeks, I was told by the rep that I could have 6 weeks." RN again advised patient that 4 weeks was what Dr. Talbert Nan typically writes [place] patient's out of work for. Patient verbalized understanding, but again wanted [place] to know "6 weeks was what she was told by the rep." Pre-op instruction sheet reviewed with patient and she verbalized understanding. Aware a copy will be mailed to her. Patient has pre-op appointment tomorrow at [facility] op appointment for 1 week and 4 weeks made. Patient agreeable to date and time of both appointments.    Routing to provider for final review. Patient agreeable to disposition. Will close encounter.    cc Lamont Snowball, RN

## 2016-09-14 ENCOUNTER — Encounter (HOSPITAL_COMMUNITY)
Admission: RE | Admit: 2016-09-14 | Discharge: 2016-09-14 | Disposition: A | Discharge status: Home / Self Care | Payer: 59 | Source: Ambulatory Visit | Attending: Obstetrics and Gynecology | Admitting: Obstetrics and Gynecology

## 2016-09-14 ENCOUNTER — Encounter (HOSPITAL_COMMUNITY): Payer: Self-pay

## 2016-09-14 DIAGNOSIS — D259 Leiomyoma of uterus, unspecified: Secondary | ICD-10-CM | POA: Diagnosis not present

## 2016-09-14 DIAGNOSIS — N946 Dysmenorrhea, unspecified: Secondary | ICD-10-CM | POA: Diagnosis not present

## 2016-09-14 DIAGNOSIS — N920 Excessive and frequent menstruation with regular cycle: Secondary | ICD-10-CM | POA: Insufficient documentation

## 2016-09-14 DIAGNOSIS — Z01812 Encounter for preprocedural laboratory examination: Secondary | ICD-10-CM | POA: Diagnosis not present

## 2016-09-14 LAB — CBC
HCT: 35.1 % — ABNORMAL LOW (ref 36.0–46.0)
Hemoglobin: 11.4 g/dL — ABNORMAL LOW (ref 12.0–15.0)
MCH: 29.5 pg (ref 26.0–34.0)
MCHC: 32.5 g/dL (ref 30.0–36.0)
MCV: 90.9 fL (ref 78.0–100.0)
PLATELETS: 438 10*3/uL — AB (ref 150–400)
RBC: 3.86 MIL/uL — AB (ref 3.87–5.11)
RDW: 13.6 % (ref 11.5–15.5)
WBC: 4.3 10*3/uL (ref 4.0–10.5)

## 2016-09-18 ENCOUNTER — Encounter (HOSPITAL_COMMUNITY): Payer: Self-pay | Admitting: Anesthesiology

## 2016-09-18 NOTE — Anesthesia Preprocedure Evaluation (Addendum)
Anesthesia Evaluation  Patient identified by MRN, date of birth, ID band Patient awake    Reviewed: Allergy & Precautions, NPO status , Patient's Chart, lab work & pertinent test results  Airway Mallampati: II  TM Distance: >3 FB Neck ROM: Full    Dental no notable dental hx.    Pulmonary former smoker,    Pulmonary exam normal breath sounds clear to auscultation       Cardiovascular negative cardio ROS Normal cardiovascular exam Rhythm:Regular Rate:Normal     Neuro/Psych negative neurological ROS  negative psych ROS   GI/Hepatic Neg liver ROS, GERD  ,  Endo/Other  negative endocrine ROS  Renal/GU negative Renal ROS     Musculoskeletal negative musculoskeletal ROS (+)   Abdominal   Peds  Hematology  (+) anemia ,   Anesthesia Other Findings   Reproductive/Obstetrics negative OB ROS                                                            Anesthesia Evaluation    Reviewed: Allergy & Precautions, Patient's Chart, lab work & pertinent test results  Airway        Dental   Pulmonary former smoker,           Cardiovascular      Neuro/Psych    GI/Hepatic   Endo/Other    Renal/GU      Musculoskeletal   Abdominal   Peds  Hematology   Anesthesia Other Findings   Reproductive/Obstetrics                             Anesthesia Physical Anesthesia Plan  ASA: I  Anesthesia Plan: General   Post-op Pain Management:    Induction: Intravenous  Airway Management Planned: Oral ETT  Additional Equipment:   Intra-op Plan:   Post-operative Plan: Extubation in OR  Informed Consent:   Plan Discussed with:   Anesthesia Plan Comments:         Anesthesia Quick Evaluation                                   Anesthesia Evaluation  Name, MR# and DOB Patient awake  General Assessment Comment  Reviewed: Allergy & Precautions,  H&P , NPO status , Patient's Chart, lab work & pertinent test results  Airway Mallampati: I TM Distance: >3 FB Neck ROM: Full    Dental No notable dental hx.    Pulmonary    Pulmonary exam normal       Cardiovascular     Neuro/Psych    GI/Hepatic Neg liver ROS    Endo/Other  Negative Endocrine ROS  Renal/GU negative Renal ROS  Genitourinary negative   Musculoskeletal negative musculoskeletal ROS (+)   Abdominal Normal abdominal exam  (+)   Peds negative pediatric ROS (+)  Hematology negative hematology ROS (+)   Anesthesia Other Findings   Reproductive/Obstetrics negative OB ROS                           Anesthesia Physical Anesthesia Plan  ASA: II  Anesthesia Plan: General   Post-op Pain Management:    Induction: Intravenous  Airway  Management Planned: LMA  Additional Equipment:   Intra-op Plan:   Post-operative Plan:   Informed Consent: I have reviewed the patients History and Physical, chart, labs and discussed the procedure including the risks, benefits and alternatives for the proposed anesthesia with the patient or authorized representative who has indicated his/her understanding and acceptance.     Plan Discussed with: CRNA  Anesthesia Plan Comments:         Anesthesia Quick Evaluation  Anesthesia Physical Anesthesia Plan  ASA: I  Anesthesia Plan: General   Post-op Pain Management:    Induction: Intravenous  Airway Management Planned: Oral ETT  Additional Equipment:   Intra-op Plan:   Post-operative Plan: Extubation in OR  Informed Consent: I have reviewed the patients History and Physical, chart, labs and discussed the procedure including the risks, benefits and alternatives for the proposed anesthesia with the patient or authorized representative who has indicated his/her understanding and acceptance.   Dental advisory given  Plan Discussed with: CRNA  Anesthesia Plan Comments:         Anesthesia Quick Evaluation

## 2016-09-20 ENCOUNTER — Encounter (HOSPITAL_COMMUNITY): Payer: Self-pay | Admitting: *Deleted

## 2016-09-20 ENCOUNTER — Ambulatory Visit (HOSPITAL_COMMUNITY): Payer: 59 | Admitting: Anesthesiology

## 2016-09-20 ENCOUNTER — Ambulatory Visit (HOSPITAL_COMMUNITY)
Admission: RE | Admit: 2016-09-20 | Discharge: 2016-09-21 | Disposition: A | Discharge status: Home / Self Care | Payer: 59 | Source: Ambulatory Visit | Attending: Obstetrics and Gynecology | Admitting: Obstetrics and Gynecology

## 2016-09-20 ENCOUNTER — Encounter (HOSPITAL_COMMUNITY)
Admission: RE | Disposition: A | Discharge status: Home / Self Care | Payer: Self-pay | Source: Ambulatory Visit | Attending: Obstetrics and Gynecology

## 2016-09-20 DIAGNOSIS — R102 Pelvic and perineal pain: Secondary | ICD-10-CM | POA: Diagnosis not present

## 2016-09-20 DIAGNOSIS — N736 Female pelvic peritoneal adhesions (postinfective): Secondary | ICD-10-CM | POA: Diagnosis not present

## 2016-09-20 DIAGNOSIS — D259 Leiomyoma of uterus, unspecified: Secondary | ICD-10-CM | POA: Insufficient documentation

## 2016-09-20 DIAGNOSIS — Z90710 Acquired absence of both cervix and uterus: Secondary | ICD-10-CM | POA: Diagnosis present

## 2016-09-20 DIAGNOSIS — Z87891 Personal history of nicotine dependence: Secondary | ICD-10-CM | POA: Diagnosis not present

## 2016-09-20 DIAGNOSIS — Z791 Long term (current) use of non-steroidal anti-inflammatories (NSAID): Secondary | ICD-10-CM | POA: Insufficient documentation

## 2016-09-20 DIAGNOSIS — N920 Excessive and frequent menstruation with regular cycle: Secondary | ICD-10-CM | POA: Insufficient documentation

## 2016-09-20 DIAGNOSIS — D649 Anemia, unspecified: Secondary | ICD-10-CM | POA: Insufficient documentation

## 2016-09-20 DIAGNOSIS — N946 Dysmenorrhea, unspecified: Secondary | ICD-10-CM | POA: Insufficient documentation

## 2016-09-20 DIAGNOSIS — N7011 Chronic salpingitis: Secondary | ICD-10-CM | POA: Insufficient documentation

## 2016-09-20 DIAGNOSIS — Z9851 Tubal ligation status: Secondary | ICD-10-CM | POA: Insufficient documentation

## 2016-09-20 DIAGNOSIS — K219 Gastro-esophageal reflux disease without esophagitis: Secondary | ICD-10-CM | POA: Diagnosis not present

## 2016-09-20 DIAGNOSIS — D251 Intramural leiomyoma of uterus: Secondary | ICD-10-CM | POA: Diagnosis not present

## 2016-09-20 HISTORY — PX: CYSTOSCOPY: SHX5120

## 2016-09-20 HISTORY — PX: LAPAROSCOPIC BILATERAL SALPINGECTOMY: SHX5889

## 2016-09-20 HISTORY — PX: LAPAROSCOPIC HYSTERECTOMY: SHX1926

## 2016-09-20 SURGERY — HYSTERECTOMY, TOTAL, LAPAROSCOPIC
Anesthesia: General | Site: Urethra

## 2016-09-20 MED ORDER — SUGAMMADEX SODIUM 200 MG/2ML IV SOLN
INTRAVENOUS | Status: DC | PRN
  Administered 2016-09-20: 200 mg via INTRAVENOUS

## 2016-09-20 MED ORDER — SCOPOLAMINE 1 MG/3DAYS TD PT72
MEDICATED_PATCH | TRANSDERMAL | Status: AC
  Administered 2016-09-20: 1.5 mg via TRANSDERMAL
  Filled 2016-09-20: qty 1

## 2016-09-20 MED ORDER — LACTATED RINGERS IV SOLN
INTRAVENOUS | Status: DC
  Administered 2016-09-20 (×2): via INTRAVENOUS

## 2016-09-20 MED ORDER — ONDANSETRON HCL 4 MG PO TABS: 4.0000 mg | ORAL_TABLET | Freq: Four times a day (QID) | ORAL | Status: DC | PRN

## 2016-09-20 MED ORDER — MORPHINE SULFATE (PF) 4 MG/ML IV SOLN
1.0000 mg | INTRAVENOUS | Status: DC | PRN
  Administered 2016-09-20 (×2): 2 mg via INTRAVENOUS
  Filled 2016-09-20 (×2): qty 1

## 2016-09-20 MED ORDER — OXYCODONE-ACETAMINOPHEN 5-325 MG PO TABS
1.0000 | ORAL_TABLET | ORAL | Status: DC | PRN
  Administered 2016-09-20 – 2016-09-21 (×3): 2 via ORAL
  Administered 2016-09-21: 1 via ORAL
  Filled 2016-09-20: qty 1
  Filled 2016-09-20 (×3): qty 2

## 2016-09-20 MED ORDER — BUPIVACAINE HCL (PF) 0.25 % IJ SOLN
INTRAMUSCULAR | Status: DC | PRN
Start: 2016-09-20 — End: 2016-09-20
  Administered 2016-09-20: 14 mL

## 2016-09-20 MED ORDER — LIDOCAINE 2% (20 MG/ML) 5 ML SYRINGE
INTRAMUSCULAR | Status: DC | PRN
  Administered 2016-09-20: 100 mg via INTRAVENOUS

## 2016-09-20 MED ORDER — FENTANYL CITRATE (PF) 250 MCG/5ML IJ SOLN
INTRAMUSCULAR | Status: AC
  Filled 2016-09-20: qty 5

## 2016-09-20 MED ORDER — OXYCODONE HCL 5 MG/5ML PO SOLN: 5.0000 mg | Freq: Once | ORAL | Status: DC | PRN

## 2016-09-20 MED ORDER — ONDANSETRON HCL 4 MG/2ML IJ SOLN
INTRAMUSCULAR | Status: DC | PRN
  Administered 2016-09-20: 4 mg via INTRAVENOUS

## 2016-09-20 MED ORDER — KETOROLAC TROMETHAMINE 30 MG/ML IJ SOLN
INTRAMUSCULAR | Status: AC
  Filled 2016-09-20: qty 1

## 2016-09-20 MED ORDER — DEXAMETHASONE SODIUM PHOSPHATE 10 MG/ML IJ SOLN
INTRAMUSCULAR | Status: DC | PRN
  Administered 2016-09-20: 10 mg via INTRAVENOUS

## 2016-09-20 MED ORDER — NAPHAZOLINE-GLYCERIN 0.012-0.2 % OP SOLN: 1.0000 [drp] | Freq: Four times a day (QID) | OPHTHALMIC | Status: DC | PRN

## 2016-09-20 MED ORDER — FENTANYL CITRATE (PF) 100 MCG/2ML IJ SOLN
INTRAMUSCULAR | Status: DC | PRN
  Administered 2016-09-20: 100 ug via INTRAVENOUS
  Administered 2016-09-20 (×3): 50 ug via INTRAVENOUS

## 2016-09-20 MED ORDER — SIMETHICONE 80 MG PO CHEW: 80.0000 mg | CHEWABLE_TABLET | Freq: Four times a day (QID) | ORAL | Status: DC | PRN

## 2016-09-20 MED ORDER — DOCUSATE SODIUM 100 MG PO CAPS
100.0000 mg | ORAL_CAPSULE | Freq: Two times a day (BID) | ORAL | Status: DC
  Administered 2016-09-20 – 2016-09-21 (×2): 100 mg via ORAL
  Filled 2016-09-20 (×2): qty 1

## 2016-09-20 MED ORDER — ZOLPIDEM TARTRATE 5 MG PO TABS: 5.0000 mg | ORAL_TABLET | Freq: Every evening | ORAL | Status: DC | PRN

## 2016-09-20 MED ORDER — HYDROMORPHONE HCL 1 MG/ML IJ SOLN
0.2500 mg | INTRAMUSCULAR | Status: DC | PRN
  Administered 2016-09-20 (×2): 0.25 mg via INTRAVENOUS

## 2016-09-20 MED ORDER — PROMETHAZINE HCL 25 MG/ML IJ SOLN: 6.2500 mg | INTRAMUSCULAR | Status: DC | PRN

## 2016-09-20 MED ORDER — CEFAZOLIN SODIUM-DEXTROSE 2-4 GM/100ML-% IV SOLN
2.0000 g | INTRAVENOUS | Status: AC
  Administered 2016-09-20: 2 g via INTRAVENOUS

## 2016-09-20 MED ORDER — STERILE WATER FOR IRRIGATION IR SOLN
Status: DC | PRN
  Administered 2016-09-20: 1000 mL

## 2016-09-20 MED ORDER — MIDAZOLAM HCL 2 MG/2ML IJ SOLN
INTRAMUSCULAR | Status: AC
  Filled 2016-09-20: qty 2

## 2016-09-20 MED ORDER — ROCURONIUM BROMIDE 10 MG/ML (PF) SYRINGE
PREFILLED_SYRINGE | INTRAVENOUS | Status: DC | PRN
  Administered 2016-09-20: 20 mg via INTRAVENOUS
  Administered 2016-09-20: 50 mg via INTRAVENOUS
  Administered 2016-09-20: 20 mg via INTRAVENOUS

## 2016-09-20 MED ORDER — SCOPOLAMINE 1 MG/3DAYS TD PT72
1.0000 | MEDICATED_PATCH | Freq: Once | TRANSDERMAL | Status: DC
  Administered 2016-09-20: 1.5 mg via TRANSDERMAL

## 2016-09-20 MED ORDER — BUPIVACAINE HCL (PF) 0.25 % IJ SOLN
INTRAMUSCULAR | Status: AC
  Filled 2016-09-20: qty 30

## 2016-09-20 MED ORDER — LACTATED RINGERS IR SOLN
Status: DC | PRN
  Administered 2016-09-20: 3000 mL

## 2016-09-20 MED ORDER — KETOROLAC TROMETHAMINE 30 MG/ML IJ SOLN
30.0000 mg | Freq: Four times a day (QID) | INTRAMUSCULAR | Status: DC
  Administered 2016-09-20 – 2016-09-21 (×3): 30 mg via INTRAVENOUS
  Filled 2016-09-20 (×2): qty 1

## 2016-09-20 MED ORDER — MIDAZOLAM HCL 5 MG/5ML IJ SOLN
INTRAMUSCULAR | Status: DC | PRN
  Administered 2016-09-20: 2 mg via INTRAVENOUS

## 2016-09-20 MED ORDER — DEXAMETHASONE SODIUM PHOSPHATE 10 MG/ML IJ SOLN
INTRAMUSCULAR | Status: AC
  Filled 2016-09-20: qty 1

## 2016-09-20 MED ORDER — KETOROLAC TROMETHAMINE 30 MG/ML IJ SOLN
30.0000 mg | Freq: Once | INTRAMUSCULAR | Status: AC | PRN
  Administered 2016-09-20: 30 mg via INTRAVENOUS

## 2016-09-20 MED ORDER — MEPERIDINE HCL 25 MG/ML IJ SOLN: 6.2500 mg | INTRAMUSCULAR | Status: DC | PRN

## 2016-09-20 MED ORDER — ROCURONIUM BROMIDE 100 MG/10ML IV SOLN
INTRAVENOUS | Status: AC
  Filled 2016-09-20: qty 1

## 2016-09-20 MED ORDER — LIDOCAINE HCL (CARDIAC) 20 MG/ML IV SOLN
INTRAVENOUS | Status: AC
  Filled 2016-09-20: qty 5

## 2016-09-20 MED ORDER — PROPOFOL 10 MG/ML IV BOLUS
INTRAVENOUS | Status: DC | PRN
  Administered 2016-09-20: 200 mg via INTRAVENOUS

## 2016-09-20 MED ORDER — HYDROMORPHONE HCL 1 MG/ML IJ SOLN
INTRAMUSCULAR | Status: AC
  Administered 2016-09-20: 0.25 mg via INTRAVENOUS
  Filled 2016-09-20: qty 1

## 2016-09-20 MED ORDER — SODIUM CHLORIDE 0.9 % IV SOLN
INTRAVENOUS | Status: DC | PRN
  Administered 2016-09-20: 08:00:00

## 2016-09-20 MED ORDER — ONDANSETRON HCL 4 MG/2ML IJ SOLN: 4.0000 mg | Freq: Four times a day (QID) | INTRAMUSCULAR | Status: DC | PRN

## 2016-09-20 MED ORDER — KETOROLAC TROMETHAMINE 30 MG/ML IJ SOLN
30.0000 mg | Freq: Four times a day (QID) | INTRAMUSCULAR | Status: DC
  Filled 2016-09-20: qty 1

## 2016-09-20 MED ORDER — ENOXAPARIN SODIUM 40 MG/0.4ML ~~LOC~~ SOLN
40.0000 mg | SUBCUTANEOUS | Status: AC
  Administered 2016-09-20: 40 mg via SUBCUTANEOUS
  Filled 2016-09-20: qty 0.4

## 2016-09-20 MED ORDER — OXYCODONE HCL 5 MG PO TABS: 5.0000 mg | ORAL_TABLET | Freq: Once | ORAL | Status: DC | PRN

## 2016-09-20 MED ORDER — SODIUM CHLORIDE 0.9 % IJ SOLN
INTRAMUSCULAR | Status: AC
  Filled 2016-09-20: qty 40

## 2016-09-20 MED ORDER — PROPOFOL 10 MG/ML IV BOLUS
INTRAVENOUS | Status: AC
  Filled 2016-09-20: qty 20

## 2016-09-20 MED ORDER — ROPIVACAINE HCL 5 MG/ML IJ SOLN
INTRAMUSCULAR | Status: AC
  Filled 2016-09-20: qty 30

## 2016-09-20 MED ORDER — HYDROMORPHONE HCL 1 MG/ML IJ SOLN
INTRAMUSCULAR | Status: AC
  Filled 2016-09-20: qty 1

## 2016-09-20 MED ORDER — SUGAMMADEX SODIUM 200 MG/2ML IV SOLN
INTRAVENOUS | Status: AC
  Filled 2016-09-20: qty 2

## 2016-09-20 MED ORDER — KCL IN DEXTROSE-NACL 20-5-0.45 MEQ/L-%-% IV SOLN
INTRAVENOUS | Status: DC
  Administered 2016-09-20: 13:00:00 via INTRAVENOUS
  Filled 2016-09-20: qty 1000

## 2016-09-20 MED ORDER — HYDROMORPHONE HCL 1 MG/ML IJ SOLN
INTRAMUSCULAR | Status: DC | PRN
  Administered 2016-09-20 (×2): 0.5 mg via INTRAVENOUS

## 2016-09-20 MED ORDER — ONDANSETRON HCL 4 MG/2ML IJ SOLN
INTRAMUSCULAR | Status: AC
Start: 2016-09-20 — End: 2016-09-20
  Filled 2016-09-20: qty 2

## 2016-09-20 SURGICAL SUPPLY — 63 items
ADH SKN CLS APL DERMABOND .7 (GAUZE/BANDAGES/DRESSINGS) ×3
APL SRG 38 LTWT LNG FL B (MISCELLANEOUS) ×3
APPLICATOR ARISTA FLEXITIP XL (MISCELLANEOUS) ×1 IMPLANT
CABLE HIGH FREQUENCY MONO STRZ (ELECTRODE) IMPLANT
CANISTER SUCT 3000ML PPV (MISCELLANEOUS) ×4 IMPLANT
CLOTH BEACON ORANGE TIMEOUT ST (SAFETY) ×4 IMPLANT
COVER MAYO STAND STRL (DRAPES) ×4 IMPLANT
DECANTER SPIKE VIAL GLASS SM (MISCELLANEOUS) ×10 IMPLANT
DERMABOND ADVANCED (GAUZE/BANDAGES/DRESSINGS) ×1
DERMABOND ADVANCED .7 DNX12 (GAUZE/BANDAGES/DRESSINGS) ×3 IMPLANT
DRSG OPSITE POSTOP 3X4 (GAUZE/BANDAGES/DRESSINGS) ×1 IMPLANT
DURAPREP 26ML APPLICATOR (WOUND CARE) ×4 IMPLANT
GLOVE BIO SURGEON STRL SZ 6.5 (GLOVE) ×8 IMPLANT
GLOVE BIOGEL PI IND STRL 7.0 (GLOVE) ×14 IMPLANT
GLOVE BIOGEL PI INDICATOR 7.0 (GLOVE) ×6
GOWN STRL REUS W/TWL LRG LVL3 (GOWN DISPOSABLE) ×16 IMPLANT
HARMONIC RUM II 2.5CM SILVER (DISPOSABLE)
HARMONIC RUM II 3.0CM SILVER (DISPOSABLE) ×4
HARMONIC RUM II 3.5CM SILVER (DISPOSABLE)
HARMONIC RUM II 4.0CM SILVER (DISPOSABLE)
HEMOSTAT ARISTA ABSORB 3G PWDR (MISCELLANEOUS) ×2 IMPLANT
LIGASURE VESSEL 5MM BLUNT TIP (ELECTROSURGICAL) ×4 IMPLANT
NEEDLE INSUFFLATION 120MM (ENDOMECHANICALS) ×4 IMPLANT
NS IRRIG 1000ML POUR BTL (IV SOLUTION) ×4 IMPLANT
OCCLUDER COLPOPNEUMO (BALLOONS) ×4 IMPLANT
PACK LAPAROSCOPY BASIN (CUSTOM PROCEDURE TRAY) ×4 IMPLANT
PACK TRENDGUARD 450 HYBRID PRO (MISCELLANEOUS) IMPLANT
PACK TRENDGUARD 600 HYBRD PROC (MISCELLANEOUS) IMPLANT
POUCH LAPAROSCOPIC INSTRUMENT (MISCELLANEOUS) ×4 IMPLANT
PROTECTOR NERVE ULNAR (MISCELLANEOUS) ×8 IMPLANT
SCALPEL HRMNC RUM II 2.5 SILVR (DISPOSABLE) IMPLANT
SCALPEL HRMNC RUM II 3.0 SILVR (DISPOSABLE) ×1 IMPLANT
SCALPEL HRMNC RUM II 3.5 SILVR (DISPOSABLE) IMPLANT
SCALPEL HRMNC RUM II 4.0 SILVR (DISPOSABLE) IMPLANT
SCISSORS LAP 5X35 DISP (ENDOMECHANICALS) IMPLANT
SET CYSTO W/LG BORE CLAMP LF (SET/KITS/TRAYS/PACK) ×2 IMPLANT
SET IRRIG TUBING LAPAROSCOPIC (IRRIGATION / IRRIGATOR) ×4 IMPLANT
SET TRI-LUMEN FLTR TB AIRSEAL (TUBING) ×4 IMPLANT
SHEARS HARMONIC ACE PLUS 36CM (ENDOMECHANICALS) ×4 IMPLANT
SUT VIC AB 0 CT1 27 (SUTURE) ×8
SUT VIC AB 0 CT1 27XBRD ANBCTR (SUTURE) ×3 IMPLANT
SUT VICRYL 0 UR6 27IN ABS (SUTURE) ×4 IMPLANT
SUT VICRYL 4-0 PS2 18IN ABS (SUTURE) ×4 IMPLANT
SUT VLOC 180 0 9IN  GS21 (SUTURE) ×1
SUT VLOC 180 0 9IN GS21 (SUTURE) ×1 IMPLANT
SYR 50ML LL SCALE MARK (SYRINGE) ×5 IMPLANT
SYSTEM CARTER THOMASON II (TROCAR) IMPLANT
TIP RUMI ORANGE 6.7MMX12CM (TIP) IMPLANT
TIP UTERINE 5.1X6CM LAV DISP (MISCELLANEOUS) ×2 IMPLANT
TIP UTERINE 6.7X10CM GRN DISP (MISCELLANEOUS) IMPLANT
TIP UTERINE 6.7X6CM WHT DISP (MISCELLANEOUS) IMPLANT
TIP UTERINE 6.7X8CM BLUE DISP (MISCELLANEOUS) ×1 IMPLANT
TOWEL OR 17X24 6PK STRL BLUE (TOWEL DISPOSABLE) ×8 IMPLANT
TRAY FOLEY CATH SILVER 14FR (SET/KITS/TRAYS/PACK) ×4 IMPLANT
TRENDGUARD 450 HYBRID PRO PACK (MISCELLANEOUS) ×4
TRENDGUARD 600 HYBRID PROC PK (MISCELLANEOUS)
TROCAR ADV FIXATION 5X100MM (TROCAR) ×4 IMPLANT
TROCAR PORT AIRSEAL 5X120 (TROCAR) ×4 IMPLANT
TROCAR XCEL NON BLADE 8MM B8LT (ENDOMECHANICALS) ×4 IMPLANT
TROCAR XCEL NON-BLD 11X100MML (ENDOMECHANICALS) ×3 IMPLANT
TROCAR XCEL NON-BLD 5MMX100MML (ENDOMECHANICALS) ×4 IMPLANT
WARMER LAPAROSCOPE (MISCELLANEOUS) ×4 IMPLANT
WATER STERILE IRR 1000ML POUR (IV SOLUTION) ×4 IMPLANT

## 2016-09-20 NOTE — Anesthesia Postprocedure Evaluation (Signed)
Anesthesia Post Note  Patient: Tammy Colon  Procedure(s) Performed: Procedure(s) (LRB): HYSTERECTOMY TOTAL LAPAROSCOPIC (N/A) LAPAROSCOPIC BILATERAL SALPINGECTOMY (Bilateral) CYSTOSCOPY possible (N/A)  Patient location during evaluation: PACU Anesthesia Type: General Level of consciousness: sedated and patient cooperative Pain management: pain level controlled Vital Signs Assessment: post-procedure vital signs reviewed and stable Respiratory status: spontaneous breathing Cardiovascular status: stable Anesthetic complications: no        Last Vitals:  Vitals:   09/20/16 1145 09/20/16 1258  BP: 126/90 121/77  Pulse: 81 64  Resp: 16 16  Temp: 37.1 C 36.9 C    Last Pain:  Vitals:   09/20/16 1629  TempSrc:   PainSc: 3    Pain Goal: Patients Stated Pain Goal: 3 (09/20/16 1546)               Nolon Nations

## 2016-09-20 NOTE — OR Nursing (Signed)
Family updated on patient condition and surgical progress 

## 2016-09-20 NOTE — Anesthesia Postprocedure Evaluation (Signed)
Anesthesia Post Note  Patient: Janelis A Norcott  Procedure(s) Performed: Procedure(s) (LRB): HYSTERECTOMY TOTAL LAPAROSCOPIC (N/A) LAPAROSCOPIC BILATERAL SALPINGECTOMY (Bilateral) CYSTOSCOPY possible (N/A)  Patient location during evaluation: [facility] Anesthesia Type: General Level of consciousness: awake and alert and oriented Pain management: pain level controlled Vital Signs Assessment: post-procedure vital signs reviewed and stable Respiratory status: spontaneous breathing, nonlabored ventilation and respiratory function stable Cardiovascular status: stable Postop Assessment: no signs of nausea or vomiting and adequate PO intake Anesthetic complications: no        Last Vitals:  Vitals:   09/20/16 1145 09/20/16 1258  BP: 126/90 121/77  Pulse: 81 64  Resp: 16 16  Temp: 37.1 C 36.9 C    Last Pain:  Vitals:   09/20/16 1315  TempSrc:   PainSc: Asleep   Pain Goal: Patients Stated Pain Goal: 3 (09/20/16 1258)               Willa Rough

## 2016-09-20 NOTE — Transfer of Care (Signed)
Immediate Anesthesia Transfer of Care Note  Patient: Tammy Colon  Procedure(s) Performed: Procedure(s) with comments: HYSTERECTOMY TOTAL LAPAROSCOPIC (N/A) LAPAROSCOPIC BILATERAL SALPINGECTOMY (Bilateral) CYSTOSCOPY possible (N/A) - possible cysto  Patient Location: PACU  Anesthesia Type:General  Level of Consciousness:  sedated, patient cooperative and responds to stimulation  Airway & Oxygen Therapy:Patient Spontanous Breathing and Patient connected to face mask oxgen  Post-op Assessment:  Report given to PACU RN and Post -op Vital signs reviewed and stable  Post vital signs:  Reviewed and stable  Last Vitals:  Vitals:   09/20/16 0619  BP: 123/68  Pulse: 61  Resp: 18  Temp: 79.4 C    Complications: No apparent anesthesia complications

## 2016-09-20 NOTE — Progress Notes (Signed)
Day of Surgery Procedure(s) (LRB): HYSTERECTOMY TOTAL LAPAROSCOPIC (N/A) LAPAROSCOPIC BILATERAL SALPINGECTOMY (Bilateral) CYSTOSCOPY possible (N/A)  Subjective: Patient is ambulating, voiding, tolerating po. Pain is about a 6/10, just took some percocet for the first time.     Objective: I have reviewed patient's vital signs and intake and output   Today's Vitals   09/20/16 1258 09/20/16 1315 09/20/16 1546 09/20/16 1629  BP: 121/77     Pulse: 64     Resp: 16     Temp: 98.5 F (36.9 C)     TempSrc: Oral     SpO2: 98%     Weight:      Height:      PainSc: 6  Asleep 5  3    .  General: alert, cooperative and no distress Resp: clear to auscultation bilaterally Cardio: S1, S2 normal GI: soft, not tender, mildly distended, +BS. Incisions are clean, dry and intact without erythema Extremities: extremities normal, atraumatic, no cyanosis or edema  Assessment: s/p Procedure(s) with comments: HYSTERECTOMY TOTAL LAPAROSCOPIC (N/A) LAPAROSCOPIC BILATERAL SALPINGECTOMY (Bilateral) CYSTOSCOPY possible (N/A) - possible cysto: stable, progressing well and tolerating diet  Plan: Encourage ambulation Advance to PO medication  Plan to d/c home in the am  LOS: 0 days    Salvadore Dom 09/20/2016, 5:46 PM

## 2016-09-20 NOTE — Anesthesia Procedure Notes (Signed)
Procedure Name: Intubation Date/Time: 09/20/2016 7:27 AM Performed by: Talbot Grumbling Pre-anesthesia Checklist: Patient identified, Emergency Drugs available, Suction available and Patient being monitored Patient Re-evaluated:Patient Re-evaluated prior to inductionOxygen Delivery Method: Circle system utilized Preoxygenation: Pre-oxygenation with 100% oxygen Intubation Type: IV induction Ventilation: Mask ventilation without difficulty Laryngoscope Size: Miller and 2 Grade View: Grade I Tube type: Oral Tube size: 7.0 mm Number of attempts: 1 Airway Equipment and Method: Stylet Placement Confirmation: ETT inserted through vocal cords under direct vision,  positive ETCO2 and breath sounds checked- equal and bilateral Secured at: 21 cm Tube secured with: Tape Dental Injury: Teeth and Oropharynx as per pre-operative assessment

## 2016-09-20 NOTE — Op Note (Signed)
Preoperative Diagnosis: Menorrhagia, dysmenorrhea, fibroid uterus, anemia, pelvic pain  Postoperative Diagnosis: Same, bilateral hydrosalpinx, adhesions  Procedure:  Total Laparoscopic Hysterectomy with bilateral salpingectomy, lysis of adhesions and cystoscopy. Removal on abdominal wall cyst  Surgeon: Dr Sumner Boast  Assistant: Dr Edwinna Areola   Anesthesia: General  EBL: 100 cc  Fluids: 1,500 cc LR   Urine output: 433 cc  Complications: None  Indications for surgery: The patient is a 46 year old female, who presented with menorrhagia, mild anemia, dysmenorrhea, and pelvic pain. Work up included an ultrasound that showed multiple small myomas and bilateral adnexal masses, the patient was having severe LLQ abdominal/pelvic pain at the time of the u/s and some features in the left adnexa were concerning for possible abscess. CT was done and was negative for an abscess. She had a normal CBC, negative STD testing, a normal pap and a negative endometrial biopsy.  Options for treatment were discussed with the patient and she desired surgery.   The patient is aware of the risks and complications involved with the surgery and consent was obtained prior to the procedure.  Findings: EUA: 9 week sized, irregular mobile uterus, no adnexal masses. Laparoscopy: fibroid uterus, normal ovaries bilaterally, evidence of bilateral tubal ligations with bilateral hydrosalpinx noted. She had some adhesions of her left tube to the pelvic side wall and of the right adnexa to the anterior abdominal wall. She had a cystic lesion on her anterior abdominal wall (removed).   Procedure: The patient was taken to the operating room with an IV in placed, preoperative antibiotics and lovenox had been administered. She was placed in the dorsal lithotomy position. General anesthesia was administered. She was prepped and draped in the usual sterile fashion for an abdominal, vaginal surgery. A rumi uterine manipulator was  placed, using a # 3 cup and a 6 cm extender. A foley catheter was placed. During the surgery the manipulator stopped working appropriately and had to be replaced.   The umbilicus was everted, injected with 0.25% marcaine and incised with a # 11 blade. 2 towel clips were used to elevated the umbilicus and a veress needle was placed into the abdominal cavity. The abdominal cavity was insufflated with CO2, with normal intraabdominal pressures. After adequate pneumo-insufflation the veress needle was removed and the 5 mm laparoscope was placed into the abdominal cavity using the opti-view trocar. The patient was placed in trendelenburg and the abdominal pelvic cavity was inspected. 3 more trocars were placed. 1 in each lower quadrant approximately 3 cm medial to the anterior superior iliac spine, these areas were injected with 0.25% marcaine, incised with a #11 blade and 5 mm trocars were inserted with direct visualization with the laparoscope (the one on the right was the #5 airseal). The last trocar was placed approximately 5 cm above the pubic symphysis in the midline. The area was injected with 0.25% marcaine and incised with a # 11 blade. An 8 mm trocar was inserted under direct visualization with the laparoscope. The abdominal pelvic cavity was again inspected. The ureters were identified bilaterally. A mixture of 30 cc of Robivacaine and 30 cc of NS was place in the pelvic cavity.   The left tube was elevated from the pelvic sidewall and the adhesions were taken down with the ligasure device. The tube was separated from the ovay with the ligasure device. The mesosalpinx was cauterized and cut with the ligasure device. The tube was separated from the uterus using the ligasure device and removed through the  midline trocar. The left round ligament was cauterized and cut with the ligasure device and the anterior and posterior leafs of the broad ligament were taken down with the ligasure device. The harmonic  scalpel was then used to take down the bladder flap and skeltonize the vessels. The left uterine vessels were then clamped, cauterized and ligated with the ligasure device. Hemostasis was excellent. The same procedure was repeated on the right with the exception of there being no adhesions to the pelvic side wall, but a long filmy adhesion of the right adnexa to the anterior abdominal wall. This was taken down with the harmonic device. There was a small cyst attaching the adhesion to the anterior abdominal wall. The cyst was removed with the harmonic device and sent to pathology with the rest of the specimen.  Using the rumi manipulator the uterus was pushed up in the pelvic cavity in order to separate the cervix from the vagina. The initial incision was made anteriorly with the harmonic scalpel, and the koh was not located. At this time it was noted that the rumi koh had slipped out of place (the extender was still in place). A new rumi was placed (the first device wasn't locking appropriately). At this time a #8 extender and 3.5 cm koh were used. The harmonic scalpel was used to separate the cervix from the vagina using the harmonic energy. The uterus was  removed vaginally at this time. An occluder was placed in the vagina to maintain pneumoperitoneum. The vaginal cuff was then closed with a 0 V-lock suture. Hemostasis was excellent. The abdominal pelvic cavity was irrigated and suctioned dry. Pressure was released and hemostasis remained excellent. Arista was placed over the cuff and pedicles.   The abdominal cavity was desufflated and the trocars were removed. The skin was closed with subcuticular stiches of 4-0 vicryl and dermabond was placed over the incisions.  The foley catheter was removed and cystoscopy was performed using a 70 degree scope. Both ureters expelled urine, no bladder abnormalities were noted. The bladder was allowed to drain and the cystoscope was removed.   The patient's abdomen and  perineum were cleansed and she was taken out of the dorsal lithotomy position. Upon awakening she was extubated and taken to the recovery room in stable condition. The sponge and instrument counts were correct.

## 2016-09-20 NOTE — Interval H&P Note (Signed)
History and Physical Interval Note:  09/20/2016 7:10 AM  Tammy Colon  has presented today for surgery, with the diagnosis of menorrhagia, dysmenorrhea, fibroids  The various methods of treatment have been discussed with the patient and family. After consideration of risks, benefits and other options for treatment, the patient has consented to  Procedure(s) with comments: HYSTERECTOMY TOTAL LAPAROSCOPIC (N/A) LAPAROSCOPIC BILATERAL SALPINGECTOMY (Bilateral) CYSTOSCOPY possible (N/A) - possible cysto as a surgical intervention .  The patient's history has been reviewed, patient examined, no change in status, stable for surgery.  I have reviewed the patient's chart and labs.  Questions were answered to the patient's satisfaction.     Salvadore Dom

## 2016-09-21 ENCOUNTER — Encounter (HOSPITAL_COMMUNITY): Payer: Self-pay | Admitting: Obstetrics and Gynecology

## 2016-09-21 DIAGNOSIS — N920 Excessive and frequent menstruation with regular cycle: Secondary | ICD-10-CM | POA: Diagnosis not present

## 2016-09-21 LAB — CBC
HEMATOCRIT: 28.8 % — AB (ref 36.0–46.0)
Hemoglobin: 9.4 g/dL — ABNORMAL LOW (ref 12.0–15.0)
MCH: 30.1 pg (ref 26.0–34.0)
MCHC: 32.6 g/dL (ref 30.0–36.0)
MCV: 92.3 fL (ref 78.0–100.0)
Platelets: 293 10*3/uL (ref 150–400)
RBC: 3.12 MIL/uL — ABNORMAL LOW (ref 3.87–5.11)
RDW: 13.8 % (ref 11.5–15.5)
WBC: 9.5 10*3/uL (ref 4.0–10.5)

## 2016-09-21 MED ORDER — DOCUSATE SODIUM 100 MG PO CAPS: 100.0000 mg | ORAL_CAPSULE | Freq: Two times a day (BID) | ORAL | 0 refills | Status: DC

## 2016-09-21 MED ORDER — OXYCODONE-ACETAMINOPHEN 5-325 MG PO TABS: 1.0000 | ORAL_TABLET | ORAL | 0 refills | Status: DC | PRN

## 2016-09-21 NOTE — Progress Notes (Signed)
Pt ambulated out teaching complete  

## 2016-09-21 NOTE — Progress Notes (Signed)
1 Day Post-Op Procedure(s) (LRB): HYSTERECTOMY TOTAL LAPAROSCOPIC (N/A) LAPAROSCOPIC BILATERAL SALPINGECTOMY (Bilateral) CYSTOSCOPY possible (N/A)  Subjective: Doing well, ambulating, tolerating po, voiding well, pain well controlled.    Objective: I have reviewed patient's vital signs and labs.  Today's Vitals   09/21/16 0130 09/21/16 0131 09/21/16 0350 09/21/16 0608  BP:  (!) 97/55  (!) 108/56  Pulse:  68  (!) 57  Resp:  16  16  Temp:  98.4 F (36.9 C)  98.6 F (37 C)  TempSrc:  Oral  Oral  SpO2:  100%  100%  Weight:      Height:      PainSc: 0-No pain  5       General: alert, cooperative and no distress Resp: clear to auscultation bilaterally Cardio: S1, S2 normal GI: soft, not tender to gentle palpation, mildly distended, +BS. Incisions: clean, dry and intact without erythema.  Extremities: extremities normal, atraumatic, no cyanosis or edema   CBC Latest Ref Rng & Units 09/21/2016 09/14/2016 08/24/2016  WBC 4.0 - 10.5 K/uL 9.5 4.3 6.5  Hemoglobin 12.0 - 15.0 g/dL 9.4(L) 11.4(L) 12.4  Hematocrit 36.0 - 46.0 % 28.8(L) 35.1(L) 36.9  Platelets 150 - 400 K/uL 293 438(H) 408(H)     Assessment: s/p Procedure(s) with comments: HYSTERECTOMY TOTAL LAPAROSCOPIC (N/A) LAPAROSCOPIC BILATERAL SALPINGECTOMY (Bilateral) CYSTOSCOPY possible (N/A) - possible cysto: stable, progressing well and tolerating diet  Plan: Discharge home  LOS: 0 days    Salvadore Dom 09/21/2016, 7:47 AM

## 2016-09-22 ENCOUNTER — Telehealth: Payer: Self-pay | Admitting: Obstetrics and Gynecology

## 2016-09-22 NOTE — Telephone Encounter (Signed)
Patient has a medication question. 

## 2016-09-22 NOTE — Telephone Encounter (Signed)
No, I agree

## 2016-09-22 NOTE — Telephone Encounter (Signed)
Spoke with patient. Patient states she received Rx for percocet and stool softener, but no ibuprofen for post op pain. Patient states she does not want to take too much of the percocet, would also like the ibuprofen for mild pain. Advised patient Rx for ibuprofen sent on 08/26/16 has 1 refill, f/u with pharmacy for filling. Patient verbalizes understanding and is agreeable.   Dr. Talbert Nan, any additional recommendations?

## 2016-09-27 ENCOUNTER — Ambulatory Visit (INDEPENDENT_AMBULATORY_CARE_PROVIDER_SITE_OTHER): Payer: 59 | Admitting: Obstetrics and Gynecology

## 2016-09-27 ENCOUNTER — Encounter: Payer: Self-pay | Admitting: Obstetrics and Gynecology

## 2016-09-27 VITALS — BP 114/60 | HR 64 | Resp 14 | Wt 145.0 lb

## 2016-09-27 DIAGNOSIS — Z90710 Acquired absence of both cervix and uterus: Secondary | ICD-10-CM

## 2016-09-27 NOTE — Progress Notes (Signed)
GYNECOLOGY  VISIT   HPI: 46 y.o.   Divorced  [place] American  female   [phone] with Patient's last menstrual period was 09/11/2016 (exact date).   here 1 week s/p TLH/BS/LOA/cystoscopy. Doing well, pain well controlled, voiding normally, having BM's. No c/o.      GYNECOLOGIC HISTORY: Patient's last menstrual period was 09/11/2016 (exact date). Contraception:hysterectomy  Menopausal hormone therapy: none         OB History    Gravida Para Term Preterm AB Living   4 3 2 1 1 3    SAB TAB Ectopic Multiple Live Births           3         Patient Active Problem List   Diagnosis Date Noted  . Status post laparoscopic hysterectomy 09/20/2016  . Irregular menses 04/06/2011    Past Medical History:  Diagnosis Date  . Abnormal uterine bleeding   . Anemia   . GERD (gastroesophageal reflux disease)    no meds  . Hormone disorder   . Migraine with aura    blurred vision    Past Surgical History:  Procedure Laterality Date  . CYSTOSCOPY N/A 09/20/2016   Procedure: CYSTOSCOPY possible;  Surgeon: Salvadore Dom, MD;  Location: [facility];  Service: Gynecology;  Laterality: N/A;  possible cysto  . hysteroscopic myomectomy    . LAPAROSCOPIC BILATERAL SALPINGECTOMY Bilateral 09/20/2016   Procedure: LAPAROSCOPIC BILATERAL SALPINGECTOMY;  Surgeon: Salvadore Dom, MD;  Location: [facility];  Service: Gynecology;  Laterality: Bilateral;  . LAPAROSCOPIC HYSTERECTOMY N/A 09/20/2016   Procedure: HYSTERECTOMY TOTAL LAPAROSCOPIC;  Surgeon: Salvadore Dom, MD;  Location: [facility];  Service: Gynecology;  Laterality: N/A;  . TUBAL LIGATION      Current Outpatient Prescriptions  Medication Sig Dispense Refill  . docusate sodium (COLACE) 100 MG capsule Take 1 capsule (100 mg total) by mouth 2 (two) times daily. 10 capsule 0  . ibuprofen (ADVIL,MOTRIN) 800 MG tablet Take 1 tablet (800 mg total) by mouth every 8 (eight) hours as needed. 30 tablet 1  . Multiple Vitamin (MULTIVITAMIN WITH MINERALS)  TABS tablet Take 1 tablet by mouth daily.    Marland Kitchen oxyCODONE-acetaminophen (PERCOCET) 5-325 MG tablet Take 1-2 tablets by mouth every 4 (four) hours as needed for moderate pain. 30 tablet 0  . Vitamin D, Ergocalciferol, (DRISDOL) 50000 units CAPS capsule Take 1 capsule (50,000 Units total) by mouth every 7 (seven) days. (Patient taking differently: Take 50,000 Units by mouth every Sunday. ) 12 capsule 0   No current facility-administered medications for this visit.      ALLERGIES: Patient has no known allergies.  Family History  Problem Relation Age of Onset  . Thyroid disease Mother   . Lupus Maternal Aunt   . Cancer Maternal Aunt   . Diabetes Daughter     Social History   Social History  . Marital status: Divorced    Spouse name: N/A  . Number of children: N/A  . Years of education: N/A   Occupational History  . Not on file.   Social History Main Topics  . Smoking status: Former Smoker    Types: Cigars, E-cigarettes  . Smokeless tobacco: Never Used     Comment: vapor   . Alcohol use 3.0 oz/week    5 Glasses of wine per week  . Drug use: No  . Sexual activity: Yes    Partners: Male    Birth control/ protection: Surgical   Other Topics Concern  . Not  on file   Social History Narrative  . No narrative on file    Review of Systems  Constitutional: Negative.   HENT: Negative.   Eyes: Negative.   Respiratory: Negative.   Cardiovascular: Negative.   Gastrointestinal: Negative.   Genitourinary: Negative.   Musculoskeletal: Negative.   Skin: Negative.   Neurological: Negative.   Endo/Heme/Allergies: Negative.   Psychiatric/Behavioral: Negative.     PHYSICAL EXAMINATION:    BP 114/60 (BP Location: Right Arm, Patient Position: Sitting, Cuff Size: Normal)   Pulse 64   Resp 14   Wt 145 lb (65.8 kg)   LMP 09/11/2016 (Exact Date)   BMI 24.89 kg/m     General appearance: alert, cooperative and appears stated age Abdomen: soft, non-tender;minimally distended, no  masses,  no organomegaly Incisions: healing well. The suprapubic incision with slight induration and bruising, suspect small hematoma, no drainage or erythema.    ASSESSMENT 1 week s/p TLH/BS, doing well    PLAN Routine f/u Call with any concerns   An After Visit Summary was printed and given to the patient.

## 2016-09-30 ENCOUNTER — Telehealth: Payer: Self-pay | Admitting: Obstetrics and Gynecology

## 2016-09-30 ENCOUNTER — Encounter: Payer: Self-pay | Admitting: Obstetrics and Gynecology

## 2016-09-30 ENCOUNTER — Ambulatory Visit (INDEPENDENT_AMBULATORY_CARE_PROVIDER_SITE_OTHER): Payer: 59 | Admitting: Obstetrics and Gynecology

## 2016-09-30 VITALS — BP 100/80 | HR 80 | Ht 64.0 in | Wt 144.0 lb

## 2016-09-30 DIAGNOSIS — T8130XA Disruption of wound, unspecified, initial encounter: Secondary | ICD-10-CM

## 2016-09-30 NOTE — Telephone Encounter (Signed)
Patient had hysterectomy 09/20/16 and was seen this Monday.  States that the incision in the middle under her belly button is bleeding and has been since yesterday.

## 2016-09-30 NOTE — Progress Notes (Signed)
GYNECOLOGY  VISIT   HPI: 46 y.o.   Divorced  [place] American  female   [phone] with Patient's last menstrual period was 09/11/2016 (exact date).   here for bleeding from bottom incision, it started yesterday, small amount. No surrounding erythema, no increase in discomfort. Overall feeling well.   GYNECOLOGIC HISTORY: Patient's last menstrual period was 09/11/2016 (exact date). Contraception: hysterectomy  Menopausal hormone therapy: none        OB History    Gravida Para Term Preterm AB Living   4 3 2 1 1 3    SAB TAB Ectopic Multiple Live Births           3         Patient Active Problem List   Diagnosis Date Noted  . Status post laparoscopic hysterectomy 09/20/2016  . Irregular menses 04/06/2011    Past Medical History:  Diagnosis Date  . Abnormal uterine bleeding   . Anemia   . GERD (gastroesophageal reflux disease)    no meds  . Hormone disorder   . Migraine with aura    blurred vision    Past Surgical History:  Procedure Laterality Date  . ABDOMINAL HYSTERECTOMY    . CYSTOSCOPY N/A 09/20/2016   Procedure: CYSTOSCOPY possible;  Surgeon: Salvadore Dom, MD;  Location: [facility];  Service: Gynecology;  Laterality: N/A;  possible cysto  . hysteroscopic myomectomy    . LAPAROSCOPIC BILATERAL SALPINGECTOMY Bilateral 09/20/2016   Procedure: LAPAROSCOPIC BILATERAL SALPINGECTOMY;  Surgeon: Salvadore Dom, MD;  Location: [facility];  Service: Gynecology;  Laterality: Bilateral;  . LAPAROSCOPIC HYSTERECTOMY N/A 09/20/2016   Procedure: HYSTERECTOMY TOTAL LAPAROSCOPIC;  Surgeon: Salvadore Dom, MD;  Location: [facility];  Service: Gynecology;  Laterality: N/A;  . TUBAL LIGATION      Current Outpatient Prescriptions  Medication Sig Dispense Refill  . ibuprofen (ADVIL,MOTRIN) 800 MG tablet Take 1 tablet (800 mg total) by mouth every 8 (eight) hours as needed. 30 tablet 1  . Multiple Vitamin (MULTIVITAMIN WITH MINERALS) TABS tablet Take 1 tablet by mouth daily.    Marland Kitchen  oxyCODONE-acetaminophen (PERCOCET) 5-325 MG tablet Take 1-2 tablets by mouth every 4 (four) hours as needed for moderate pain. 30 tablet 0  . Vitamin D, Ergocalciferol, (DRISDOL) 50000 units CAPS capsule Take 1 capsule (50,000 Units total) by mouth every 7 (seven) days. (Patient taking differently: Take 50,000 Units by mouth every Sunday. ) 12 capsule 0   No current facility-administered medications for this visit.      ALLERGIES: Patient has no known allergies.  Family History  Problem Relation Age of Onset  . Thyroid disease Mother   . Lupus Maternal Aunt   . Cancer Maternal Aunt   . Diabetes Daughter     Social History   Social History  . Marital status: Divorced    Spouse name: N/A  . Number of children: N/A  . Years of education: N/A   Occupational History  . Not on file.   Social History Main Topics  . Smoking status: Former Smoker    Types: Cigars, E-cigarettes  . Smokeless tobacco: Never Used     Comment: vapor   . Alcohol use 3.0 oz/week    5 Glasses of wine per week  . Drug use: No  . Sexual activity: Yes    Partners: Male    Birth control/ protection: Surgical   Other Topics Concern  . Not on file   Social History Narrative  . No narrative on file  Review of Systems  Constitutional: Negative.   HENT: Negative.   Eyes: Negative.   Respiratory: Negative.   Cardiovascular: Negative.   Gastrointestinal: Negative.   Genitourinary: Negative.   Musculoskeletal: Negative.   Skin: Negative.   Neurological: Negative.   Endo/Heme/Allergies: Negative.   Psychiatric/Behavioral: Negative.     PHYSICAL EXAMINATION:    BP 100/80 (BP Location: Right Arm, Patient Position: Sitting, Cuff Size: Normal)   Pulse 80   Ht 5\' 4"  (1.626 m)   Wt 144 lb (65.3 kg)   LMP 09/11/2016 (Exact Date)   BMI 24.72 kg/m     General appearance: alert, cooperative and appears stated age Abdomen: soft, non-tender; non distended; no masses,  no organomegaly Incisions: the  suprapubic incision with a small hematoma, not actively bleeding, small amount of dried blood seen on the tissue over the wound. No surrounding erythema, not tender. The incision was probed with a small sterile cotton swab soaked in sterile saline, able to open the right side of the wound, very small opening. Able to express a small amount of clot. No serous fluid.    ASSESSMENT Small wound hematoma, slight drainage, area opened slightly with small amount of clot removed.     PLAN The patient was given some gauze  She will call with any increase in sypmtoms Otherwise f/u on Monday   An After Visit Summary was printed and given to the patient.

## 2016-09-30 NOTE — Telephone Encounter (Signed)
Spoke with patient. Patient states she has been experiencing some bleeding from incision that is located just below belly button. Patient states started oozing yesterday and bleeding more today, "gushing" at times. Patient states it looks like the adhesive is coming off and opening more. Patient states she currently has a tissue on the site. Advised patient to cover incision with clean gauze and apply pressure to incision . Recommended OV for further evaluation, patient scheduled for 1:15pm with Dr. Talbert Nan. Patient verbalizes understanding and is agreeable.  Routing to provider for final review. Patient is agreeable to disposition. Will close encounter.

## 2016-10-04 ENCOUNTER — Ambulatory Visit: Payer: 59 | Admitting: Obstetrics and Gynecology

## 2016-10-04 NOTE — Telephone Encounter (Signed)
Patient canceled her 2 day incision recheck this afternoon. Patient said "My incision looks much better and I will see Dr.Jertson at my next appointment 10/18/16".

## 2016-10-04 NOTE — Telephone Encounter (Signed)
Dr. Talbert Nan, routing [facility].

## 2016-10-06 NOTE — Telephone Encounter (Signed)
Routing to provider for final review. Will close encounter.     

## 2016-10-18 ENCOUNTER — Encounter: Payer: Self-pay | Admitting: Obstetrics and Gynecology

## 2016-10-18 ENCOUNTER — Ambulatory Visit (INDEPENDENT_AMBULATORY_CARE_PROVIDER_SITE_OTHER): Payer: 59 | Admitting: Obstetrics and Gynecology

## 2016-10-18 VITALS — BP 112/70 | HR 64 | Resp 14 | Wt 142.0 lb

## 2016-10-18 DIAGNOSIS — Z90710 Acquired absence of both cervix and uterus: Secondary | ICD-10-CM

## 2016-10-18 DIAGNOSIS — K5900 Constipation, unspecified: Secondary | ICD-10-CM

## 2016-10-18 MED ORDER — IBUPROFEN 800 MG PO TABS: 800.0000 mg | ORAL_TABLET | Freq: Three times a day (TID) | ORAL | 1 refills | Status: DC | PRN

## 2016-10-18 NOTE — Progress Notes (Signed)
GYNECOLOGY  VISIT   HPI: 46 y.o.   Divorced  [place] American  female   [phone] with Patient's last menstrual period was 09/11/2016 (exact date).   here for follow up. Patient 4 weeks s/p TLH/BS/LOA/cystoscopy. Overall feeling well. Normal bladder function. She has had some constipation since surgery, improving some. Normally has a BM every day, now every 2 days. Needs to strain. Some random pains, still tired. Requests more ibuprofen.     GYNECOLOGIC HISTORY: Patient's last menstrual period was 09/11/2016 (exact date). Contraception:Hysterectomy  Menopausal hormone therapy: none         OB History    Gravida Para Term Preterm AB Living   4 3 2 1 1 3    SAB TAB Ectopic Multiple Live Births           3         Patient Active Problem List   Diagnosis Date Noted  . Status post laparoscopic hysterectomy 09/20/2016  . Irregular menses 04/06/2011    Past Medical History:  Diagnosis Date  . Abnormal uterine bleeding   . Anemia   . GERD (gastroesophageal reflux disease)    no meds  . Hormone disorder   . Migraine with aura    blurred vision    Past Surgical History:  Procedure Laterality Date  . ABDOMINAL HYSTERECTOMY    . CYSTOSCOPY N/A 09/20/2016   Procedure: CYSTOSCOPY possible;  Surgeon: Salvadore Dom, MD;  Location: [facility];  Service: Gynecology;  Laterality: N/A;  possible cysto  . hysteroscopic myomectomy    . LAPAROSCOPIC BILATERAL SALPINGECTOMY Bilateral 09/20/2016   Procedure: LAPAROSCOPIC BILATERAL SALPINGECTOMY;  Surgeon: Salvadore Dom, MD;  Location: [facility];  Service: Gynecology;  Laterality: Bilateral;  . LAPAROSCOPIC HYSTERECTOMY N/A 09/20/2016   Procedure: HYSTERECTOMY TOTAL LAPAROSCOPIC;  Surgeon: Salvadore Dom, MD;  Location: [facility];  Service: Gynecology;  Laterality: N/A;  . TUBAL LIGATION      Current Outpatient Prescriptions  Medication Sig Dispense Refill  . ibuprofen (ADVIL,MOTRIN) 800 MG tablet Take 1 tablet (800 mg total) by mouth  every 8 (eight) hours as needed. 30 tablet 1  . Multiple Vitamin (MULTIVITAMIN WITH MINERALS) TABS tablet Take 1 tablet by mouth daily.    Marland Kitchen oxyCODONE-acetaminophen (PERCOCET) 5-325 MG tablet Take 1-2 tablets by mouth every 4 (four) hours as needed for moderate pain. 30 tablet 0  . Vitamin D, Ergocalciferol, (DRISDOL) 50000 units CAPS capsule Take 1 capsule (50,000 Units total) by mouth every 7 (seven) days. (Patient taking differently: Take 50,000 Units by mouth every Sunday. ) 12 capsule 0   No current facility-administered medications for this visit.      ALLERGIES: Patient has no known allergies.  Family History  Problem Relation Age of Onset  . Thyroid disease Mother   . Lupus Maternal Aunt   . Cancer Maternal Aunt   . Diabetes Daughter     Social History   Social History  . Marital status: Divorced    Spouse name: N/A  . Number of children: N/A  . Years of education: N/A   Occupational History  . Not on file.   Social History Main Topics  . Smoking status: Former Smoker    Types: Cigars, E-cigarettes  . Smokeless tobacco: Never Used     Comment: vapor   . Alcohol use 3.0 oz/week    5 Glasses of wine per week  . Drug use: No  . Sexual activity: Yes    Partners: Male    Birth  control/ protection: Surgical   Other Topics Concern  . Not on file   Social History Narrative  . No narrative on file    Review of Systems  Constitutional: Negative.   HENT: Negative.   Eyes: Negative.   Respiratory: Negative.   Cardiovascular: Negative.   Gastrointestinal: Negative.   Genitourinary:       Sharp pains in abdominal area  Musculoskeletal: Negative.   Skin: Negative.   Neurological: Negative.   Endo/Heme/Allergies: Negative.   Psychiatric/Behavioral: Negative.     PHYSICAL EXAMINATION:    BP 112/70 (BP Location: Right Arm, Patient Position: Sitting, Cuff Size: Normal)   Pulse 64   Resp 14   Wt 142 lb (64.4 kg)   LMP 09/11/2016 (Exact Date)   BMI 24.37  kg/m     General appearance: alert, cooperative and appears stated age Abdomen: soft, non-tender; bowel sounds normal; no masses,  no organomegaly, slightly distended Incisions: well healed  Pelvic: External genitalia:  no lesions              Urethra:  normal appearing urethra with no masses, tenderness or lesions              Bartholins and Skenes: normal                 Vagina: normal appearing vagina with normal color and discharge, no lesions              Cervix: absent and cuff is healing well              Bimanual Exam:  Uterus:  uterus absent              Adnexa: no mass, fullness, tenderness               Chaperone was present for exam.  ASSESSMENT 4 weeks s/p TLH, doing well Some issues with constipation Fatigue, improving Occasional discomfort, requests more ibuprofen    PLAN Routine f/u Avoid intercourse until 12 weeks post op Ibuprofen refilled Information on Constipation given F/U next month for vit d  An After Visit Summary was printed and given to the patient.

## 2016-10-18 NOTE — Patient Instructions (Addendum)
Try miralax daily until you feel you are back to normal.   About Constipation  Constipation Overview Constipation is the most common gastrointestinal complaint - about 4 million Americans experience constipation and make 2.5 million physician visits a year to get help for the problem.  Constipation can occur when the colon absorbs too much water, the colon's muscle contraction is slow or sluggish, and/or there is delayed transit time through the colon.  The result is stool that is hard and dry.  Indicators of constipation include straining during bowel movements greater than 25% of the time, having fewer than three bowel movements per week, and/or the feeling of incomplete evacuation.  There are established guidelines ([place] II ) for defining constipation. A person needs to have two or more of the following symptoms for at least 12 weeks (not necessarily consecutive) in the preceding 12 months: . Straining in  greater than 25% of bowel movements . Lumpy or hard stools in greater than 25% of bowel movements . Sensation of incomplete emptying in greater than 25% of bowel movements . Sensation of anorectal obstruction/blockade in greater than 25% of bowel movements . Manual maneuvers to help empty greater than 25% of bowel movements (e.g., digital evacuation, support of the pelvic floor)  . Less than  3 bowel movements/week . Loose stools are not present, and criteria for irritable bowel syndrome are insufficient  Common Causes of Constipation . Lack of fiber in your diet . Lack of physical activity . Medications, including iron and calcium supplements  . Dairy intake . Dehydration . Abuse of laxatives  Travel  Irritable Bowel Syndrome  Pregnancy  Luteal phase of menstruation (after ovulation and before menses)  Colorectal problems  Intestinal Dysfunction  Treating Constipation  There are several ways of treating constipation, including changes to diet and exercise, use of  laxatives, adjustments to the pelvic floor, and scheduled toileting.  These treatments include: . increasing fiber and fluids in the diet  . increasing physical activity . learning muscle coordination   learning proper toileting techniques and toileting modifications   designing and sticking  to a toileting schedule     2007, [employer] Doc.22

## 2016-10-29 ENCOUNTER — Telehealth: Payer: Self-pay | Admitting: Obstetrics and Gynecology

## 2016-10-29 NOTE — Telephone Encounter (Signed)
Patient returning call.

## 2016-10-29 NOTE — Telephone Encounter (Signed)
[employer] Disability forms were completed for patient and faxed on 09/22/16. Patient left FMLA forms to complete for her on October 25, 2016.  Left message on machine requesting a return call from the patient. Upon return call, we will need to verify where the forms should be sent.  Routing to Dr Talbert Nan

## 2016-11-01 NOTE — Telephone Encounter (Signed)
Patient calling regarding paperwork.

## 2016-11-02 NOTE — Telephone Encounter (Signed)
Routing to provider for final review. Patient agreeable to disposition. Will close encounter.     

## 2016-11-02 NOTE — Telephone Encounter (Signed)
Patient returned call

## 2016-11-02 NOTE — Telephone Encounter (Signed)
Spoke with patient today, She advised, FMLA forms need to be faxed to [employer].  Patient also advises she can return to work, once employer receives [employer] forms.  FMLA forms have been completed and faxed to designated individual,   I called patient to convey this information, but was connected with her voicemail.  Per patients designated party release, ok to leave detail message.  I left a message stating forms have been completed and faxed. I also clearly advised in message since forms faxed today, the return to work date is 11/03/16. I also requested in message for patient to return call with any questions.  Routing to Dr Talbert Nan  cc: Thayer Ohm  cc: Lamont Snowball

## 2016-11-02 NOTE — Telephone Encounter (Signed)
I was able to reach patient on second attempt. Conveyed to patient forms have been completed and faxed to [employer] [facility], and advised return to work on 11/03/16. Patient understood and had no further questions.  Routing to Dr Talbert Nan  cc: Thayer Ohm  cc: Lamont Snowball

## 2016-11-18 ENCOUNTER — Telehealth: Payer: Self-pay | Admitting: Obstetrics and Gynecology

## 2016-11-18 ENCOUNTER — Other Ambulatory Visit: Payer: Self-pay

## 2016-11-18 NOTE — Telephone Encounter (Signed)
Patient will call back to reschedule lab appointment once she get her work schedule.

## 2016-11-20 ENCOUNTER — Other Ambulatory Visit: Payer: Self-pay | Admitting: Obstetrics and Gynecology

## 2017-06-30 ENCOUNTER — Encounter: Payer: Self-pay | Admitting: Obstetrics and Gynecology

## 2017-08-31 ENCOUNTER — Ambulatory Visit: Payer: 59 | Admitting: Obstetrics and Gynecology

## 2017-08-31 ENCOUNTER — Encounter: Payer: Self-pay | Admitting: Obstetrics and Gynecology

## 2017-10-05 ENCOUNTER — Encounter: Payer: Self-pay | Admitting: Obstetrics and Gynecology

## 2018-06-28 IMAGING — CT CT PELVIS W/ CM
1 series · 15 of 32 positions shown, 19 images · IV contrast (APPLIED)
Comparison: None.

CLINICAL DATA: LEFT lower quadrant pain, history abnormal uterine
bleeding, tubal ligation, former smoker, GERD

EXAM:
CT PELVIS WITH CONTRAST
TECHNIQUE: Multidetector CT imaging of the pelvis was performed using the
standard protocol following the bolus administration of intravenous
contrast. Sagittal and coronal MPR images reconstructed from axial
data set.
CONTRAST:  100mL 1L01D2-VQQ IOPAMIDOL (1L01D2-VQQ) INJECTION 61% IV.
No oral contrast.

[Series 2: routine pelvis w/cm · axial · 0.73mm/px · z∈[-245,-35]mm · 15 of 48 slices shown, 19 images]
[im 4/48  soft-tissue]
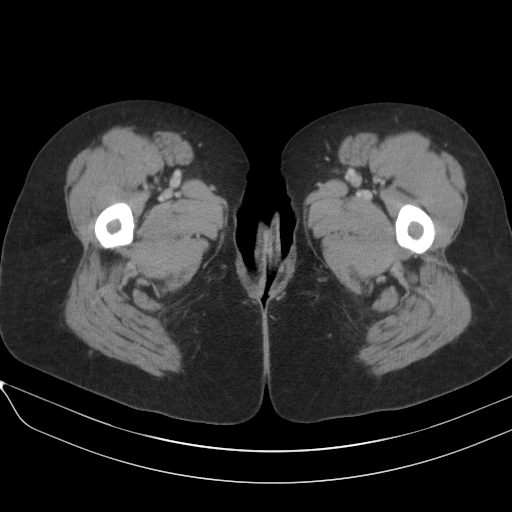
[im 4/48  bone]
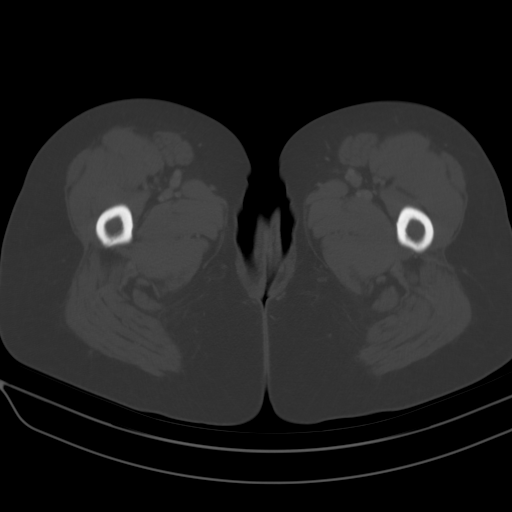
[im 7/48  soft-tissue]
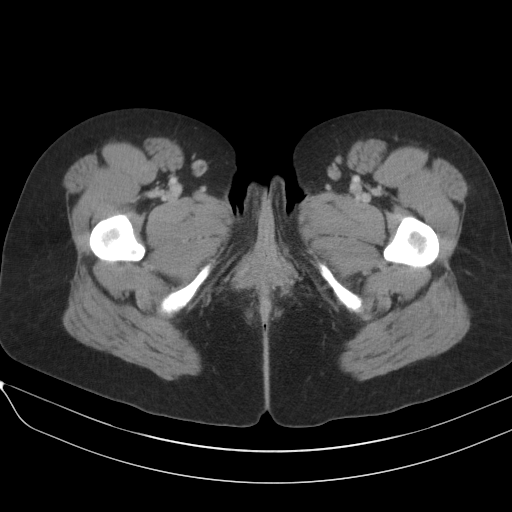
[im 10/48  soft-tissue]
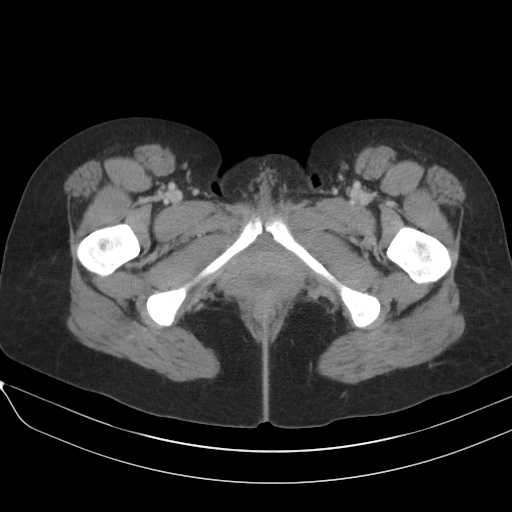
[im 14/48  soft-tissue]
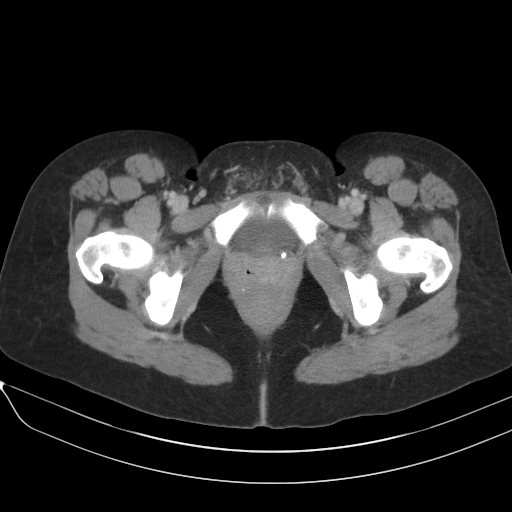
[im 17/48  soft-tissue]
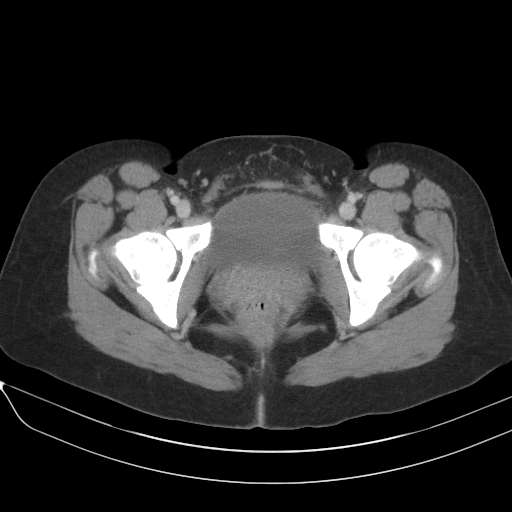
[im 20/48  soft-tissue]
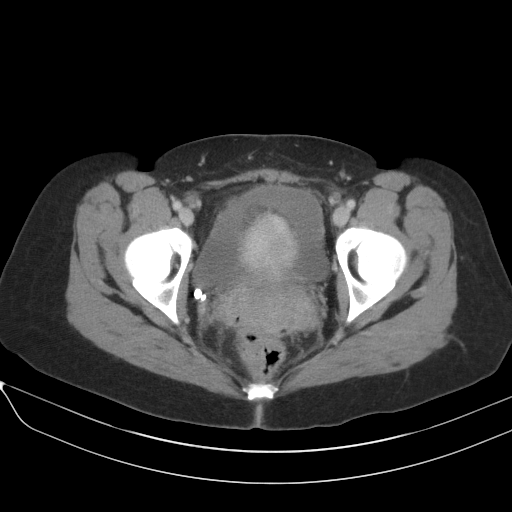
[im 25/48  soft-tissue]
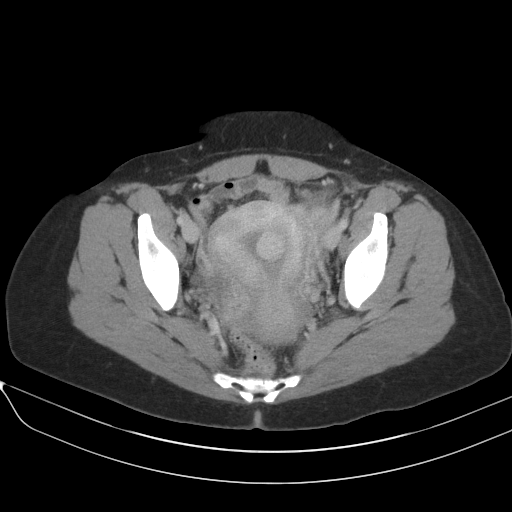
[im 28/48  soft-tissue]
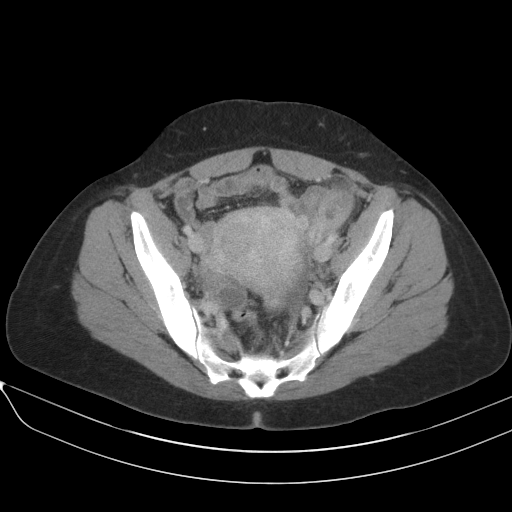
[im 31/48  soft-tissue]
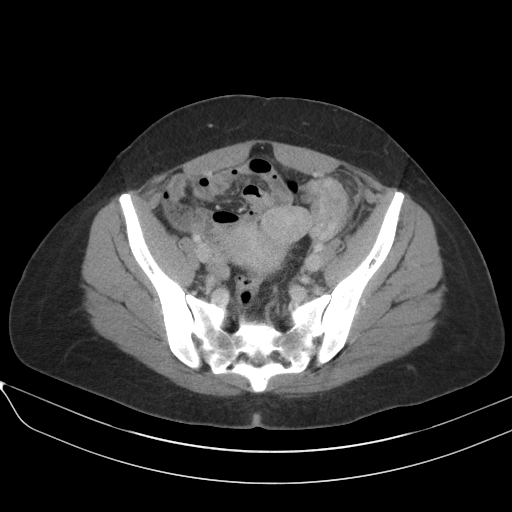
[im 31/48  bone]
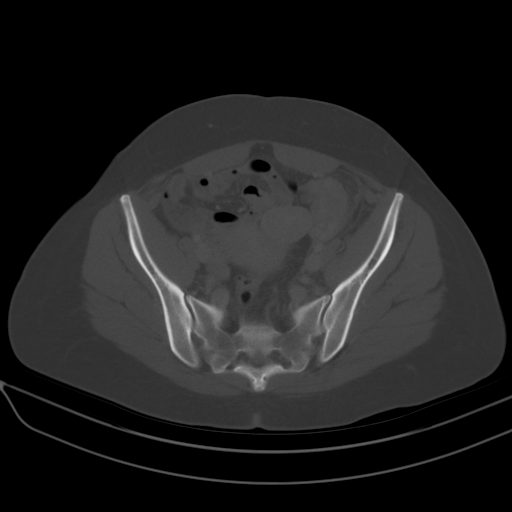
[im 34/48  soft-tissue]
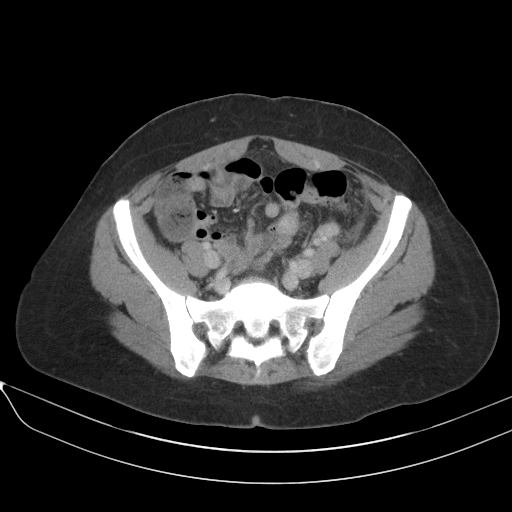
[im 38/48  soft-tissue]
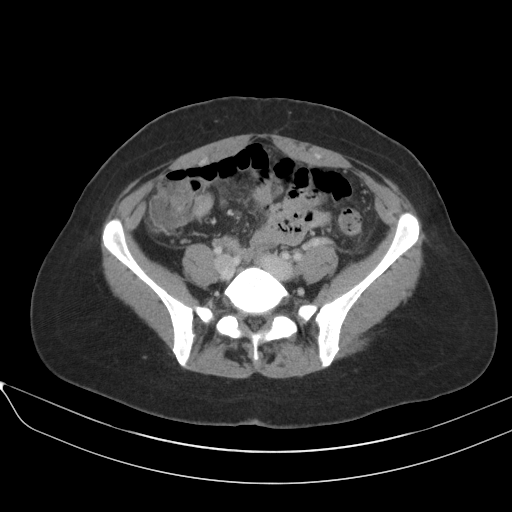
[im 41/48  soft-tissue]
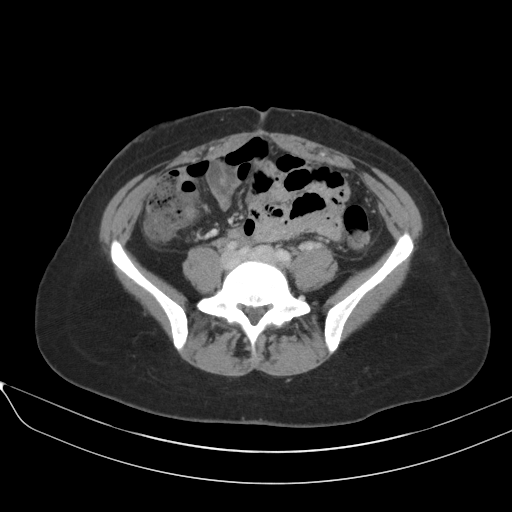
[im 41/48  lung]
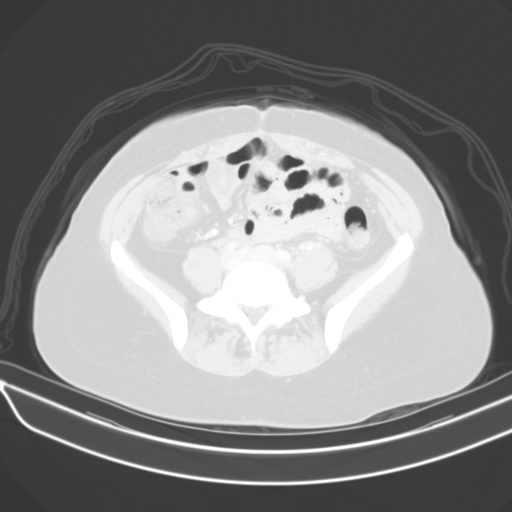
[im 43/48  lung]
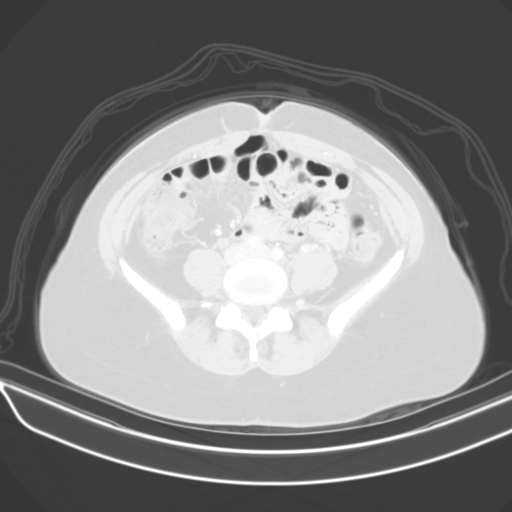
[im 44/48  soft-tissue]
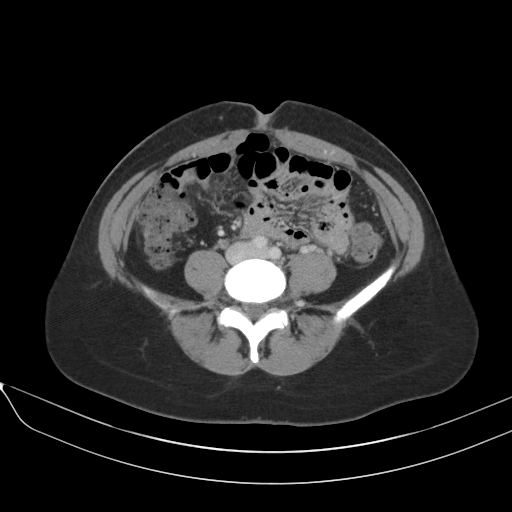
[im 44/48  lung]
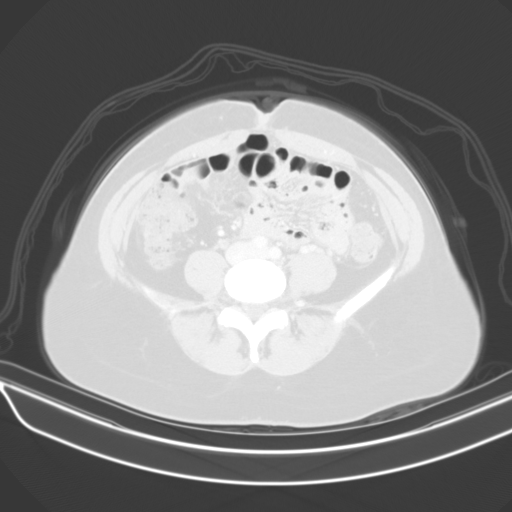
[im 46/48  lung]
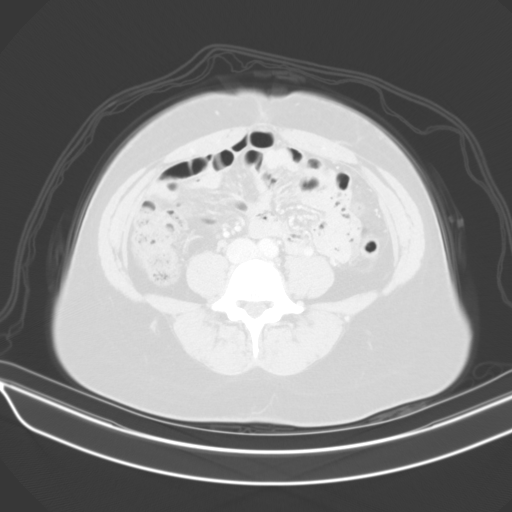

[15 of 32 positions shown; findings below may reference images not displayed]

FINDINGS: Urinary Tract:  Unremarkable bladder and distal ureters

Bowel: Normal appendix. Visualized large and small bowel loops in
pelvis unremarkable

Vascular/Lymphatic: Scattered pelvic phleboliths. No definite
adenopathy.

Reproductive: Unremarkable RIGHT adnexa. Probable 12 x 10 x 16 mm
cyst within a mildly enlarged LEFT ovary. Enlarged uterus containing
multiple nodules likely representing leiomyomata, including a
hypoechoic intramural RIGHT lateral leiomyoma 2.7 x 2.6 x 2.4 cm and
an exophytic LEFT fundal leiomyoma 3.8 x 2.8 x 2.8 cm. Additional
nodule within the central uterus measuring 2.4 x 2.1 x 1.8 cm, may
represent a submucosal leiomyoma impinging upon the endometrial
canal or an endometrial polyp/tumor.

Other: Small amount of free pelvic fluid in cul de sac. No free air.
No hernia.

Musculoskeletal: Osseous structures unremarkable
IMPRESSION: Enlarged uterus containing multiple nodules likely representing
uterine leiomyomata, largest an exophytic LEFT fundal leiomyoma
x 2.8 x 2.8 cm.

Additional central 2.4 x 2.1 x 1.8 cm diameter mass at a prominent
endometrial complex may either represent a submucosal leiomyoma
impinging upon the endometrial canal or an endometrial polyp/tumor;
hysterosonography recommended to characterize and to exclude
endometrial neoplasm.

These results will be called to the ordering clinician or
representative by the Radiologist Assistant, and communication
documented in the PACS or zVision Dashboard.

## 2018-06-29 ENCOUNTER — Encounter (HOSPITAL_COMMUNITY): Payer: Self-pay | Admitting: Emergency Medicine

## 2018-06-29 ENCOUNTER — Ambulatory Visit (HOSPITAL_COMMUNITY)
Admission: EM | Admit: 2018-06-29 | Discharge: 2018-06-29 | Disposition: A | Discharge status: Home / Self Care | Payer: 59 | Attending: Family Medicine | Admitting: Family Medicine

## 2018-06-29 ENCOUNTER — Other Ambulatory Visit: Payer: Self-pay

## 2018-06-29 DIAGNOSIS — N644 Mastodynia: Secondary | ICD-10-CM | POA: Insufficient documentation

## 2018-06-29 MED ORDER — SULFAMETHOXAZOLE-TRIMETHOPRIM 800-160 MG PO TABS: 1.0000 | ORAL_TABLET | Freq: Two times a day (BID) | ORAL | 0 refills | Status: DC

## 2018-06-29 NOTE — ED Triage Notes (Addendum)
Cyst for over a year.  In the last 24 hours has tripled in size.  Increase in size and increase in pain  Had a mammogram 2 years ago and no problems

## 2018-06-29 NOTE — Discharge Instructions (Addendum)
We will go ahead and treat you today for an infection in the breast with possible abscess I am ordering a diagnostic ultrasound and mammogram You will have this done at the breast center If you have not heard from them by Monday please give them a call The number is listed above on your discharge papers You can do ibuprofen for pain

## 2018-06-29 NOTE — ED Provider Notes (Signed)
[place]    CSN: [id number] Arrival date & time: 06/29/18  1649     History   Chief Complaint Chief Complaint  Patient presents with  . Appointment    5:00pm  . Cyst    HPI Tammy Colon is a 48 y.o. female.   Patient is a 48 year old female with past medical history of abnormal uterine bleeding, anemia, GERD, migraine.  She presents for mass on right breast area.  Approximately 1 year ago it started as a small cystlike lesion.  She reports that in the last 24 hours it  has tripled in size and became more painful.  It has slightly reddened and warm to touch.  She denies any associated fever, chills.  Denies any drainage from the area.  She has not done anything to treat the problem.  Her last mammogram was approximately 2 years ago.  She does not currently have an OB/GYN that she sees due to insurance issues.  She has had a hysterectomy.   ROS per HPI      Past Medical History:  Diagnosis Date  . Abnormal uterine bleeding   . Anemia   . GERD (gastroesophageal reflux disease)    no meds  . Hormone disorder   . Migraine with aura    blurred vision    Patient Active Problem List   Diagnosis Date Noted  . Status post laparoscopic hysterectomy 09/20/2016  . Irregular menses 04/06/2011    Past Surgical History:  Procedure Laterality Date  . ABDOMINAL HYSTERECTOMY    . CYSTOSCOPY N/A 09/20/2016   Procedure: CYSTOSCOPY possible;  Surgeon: Salvadore Dom, MD;  Location: [facility];  Service: Gynecology;  Laterality: N/A;  possible cysto  . hysteroscopic myomectomy    . LAPAROSCOPIC BILATERAL SALPINGECTOMY Bilateral 09/20/2016   Procedure: LAPAROSCOPIC BILATERAL SALPINGECTOMY;  Surgeon: Salvadore Dom, MD;  Location: [facility];  Service: Gynecology;  Laterality: Bilateral;  . LAPAROSCOPIC HYSTERECTOMY N/A 09/20/2016   Procedure: HYSTERECTOMY TOTAL LAPAROSCOPIC;  Surgeon: Salvadore Dom, MD;  Location: [facility];  Service: Gynecology;  Laterality: N/A;    . TUBAL LIGATION      OB History    Gravida  4   Para  3   Term  2   Preterm  1   AB  1   Living  3     SAB      TAB      Ectopic      Multiple      Live Births  3            Home Medications    Prior to Admission medications   Medication Sig Start Date End Date Taking? Authorizing Provider  ibuprofen (ADVIL,MOTRIN) 800 MG tablet Take 1 tablet (800 mg total) by mouth every 8 (eight) hours as needed. 10/18/16   Salvadore Dom, MD  Multiple Vitamin (MULTIVITAMIN WITH MINERALS) TABS tablet Take 1 tablet by mouth daily.    [provider]  sulfamethoxazole-trimethoprim (BACTRIM DS,SEPTRA DS) 800-160 MG tablet Take 1 tablet by mouth 2 (two) times daily for 10 days. 06/30/18 07/10/18  Lawyer, Harrell Gave, PA-C  traMADol (ULTRAM) 50 MG tablet Take 1 tablet (50 mg total) by mouth every 6 (six) hours as needed for severe pain. 06/30/18   Dalia Heading, PA-C    Family History Family History  Problem Relation Age of Onset  . Thyroid disease Mother   . Lupus Maternal Aunt   . Cancer Maternal Aunt   . Diabetes  Daughter     Social History Social History   Tobacco Use  . Smoking status: Former Smoker    Types: Cigars, E-cigarettes  . Smokeless tobacco: Never Used  . Tobacco comment: vapor   Substance Use Topics  . Alcohol use: Yes    Alcohol/week: 5.0 standard drinks    Types: 5 Glasses of wine per week  . Drug use: No     Allergies   Patient has no known allergies.   Review of Systems Review of Systems   Physical Exam Triage Vital Signs ED Triage Vitals  Enc Vitals Group     BP 06/29/18 1733 123/85     Pulse Rate 06/29/18 1733 86     Resp 06/29/18 1733 18     Temp 06/29/18 1733 98.5 F (36.9 C)     Temp Source 06/29/18 1733 Oral     SpO2 06/29/18 1733 98 %     Weight --      Height --      Head Circumference --      Peak Flow --      Pain Score 06/29/18 1737 4     Pain Loc --      Pain Edu? --      Excl. in [place]? --     No data found.  Updated Vital Signs BP 123/85 (BP Location: Left Arm)   Pulse 86   Temp 98.5 F (36.9 C) (Oral)   Resp 18   LMP 09/11/2016 (Exact Date)   SpO2 98%   Visual Acuity Right Eye Distance:   Left Eye Distance:   Bilateral Distance:    Right Eye Near:   Left Eye Near:    Bilateral Near:     Physical Exam Vitals signs and nursing note reviewed.  Constitutional:      General: She is not in acute distress.    Appearance: Normal appearance. She is not ill-appearing, toxic-appearing or diaphoretic.  HENT:     Head: Normocephalic and atraumatic.  Neck:     Musculoskeletal: Normal range of motion.  Pulmonary:     Effort: Pulmonary effort is normal.  Chest:     Breasts:        Right: Swelling, mass, skin change and tenderness present. No bleeding, inverted nipple or nipple discharge.        Comments: Large, firm palpable mass to the right breast, tender to touch and erythema is presents. No fluctuance or drainage.  Irregular in shape  Nipple appears normal.  Abdominal:     General: Abdomen is flat.  Musculoskeletal: Normal range of motion.  Skin:    General: Skin is warm and dry.  Neurological:     Mental Status: She is alert.  Psychiatric:        Mood and Affect: Mood normal.      UC Treatments / Results  Labs (all labs ordered are listed, but only abnormal results are displayed) Labs Reviewed - No data to display  EKG None  Radiology No results found.  Procedures Procedures (including critical care time)  Medications Ordered in UC Medications - No data to display  Initial Impression / Assessment and Plan / UC Course  I have reviewed the triage vital signs and the nursing notes.  Pertinent labs & imaging results that were available during my care of the patient were reviewed by me and considered in my medical decision making (see chart for details).     I am worried about the mass on her  breast. Differential include abscess with  cellulitis vs fibrous tumor vs cancer She needs a diagnostic mammogram and ultrasound.  Will place order Placing her on antibiotics to see if this helps the problem.  Ibuprofen for pain She needs follow up very soon.  Pt understanding of importance.   Final Clinical Impressions(s) / UC Diagnoses   Final diagnoses:  Breast pain     Discharge Instructions     We will go ahead and treat you today for an infection in the breast with possible abscess I am ordering a diagnostic ultrasound and mammogram You will have this done at the breast center If you have not heard from them by Monday please give them a call The number is listed above on your discharge papers You can do ibuprofen for pain    ED Prescriptions    Medication Sig Dispense Auth. Provider   sulfamethoxazole-trimethoprim (BACTRIM DS,SEPTRA DS) 800-160 MG tablet Take 1 tablet by mouth 2 (two) times daily for 7 days. 14 tablet Loura Halt A, NP     Controlled Substance Prescriptions  Controlled Substance Registry consulted? Not Applicable   Orvan July, NP 06/30/18 1201

## 2018-06-30 ENCOUNTER — Encounter (HOSPITAL_BASED_OUTPATIENT_CLINIC_OR_DEPARTMENT_OTHER): Payer: Self-pay | Admitting: Emergency Medicine

## 2018-06-30 ENCOUNTER — Other Ambulatory Visit: Payer: Self-pay | Admitting: Family Medicine

## 2018-06-30 ENCOUNTER — Other Ambulatory Visit: Payer: Self-pay

## 2018-06-30 ENCOUNTER — Other Ambulatory Visit (HOSPITAL_COMMUNITY): Payer: Self-pay | Admitting: *Deleted

## 2018-06-30 ENCOUNTER — Other Ambulatory Visit: Payer: Self-pay | Admitting: Obstetrics and Gynecology

## 2018-06-30 ENCOUNTER — Emergency Department (HOSPITAL_BASED_OUTPATIENT_CLINIC_OR_DEPARTMENT_OTHER)
Admission: EM | Admit: 2018-06-30 | Discharge: 2018-06-30 | Disposition: A | Discharge status: Home / Self Care | Payer: Self-pay | Attending: Emergency Medicine | Admitting: Emergency Medicine

## 2018-06-30 DIAGNOSIS — N6310 Unspecified lump in the right breast, unspecified quadrant: Secondary | ICD-10-CM

## 2018-06-30 DIAGNOSIS — N644 Mastodynia: Secondary | ICD-10-CM

## 2018-06-30 DIAGNOSIS — N611 Abscess of the breast and nipple: Secondary | ICD-10-CM | POA: Insufficient documentation

## 2018-06-30 DIAGNOSIS — Z79899 Other long term (current) drug therapy: Secondary | ICD-10-CM | POA: Insufficient documentation

## 2018-06-30 DIAGNOSIS — Z87891 Personal history of nicotine dependence: Secondary | ICD-10-CM | POA: Insufficient documentation

## 2018-06-30 MED ORDER — SULFAMETHOXAZOLE-TRIMETHOPRIM 800-160 MG PO TABS: 1.0000 | ORAL_TABLET | Freq: Two times a day (BID) | ORAL | 0 refills | Status: AC

## 2018-06-30 MED ORDER — TRAMADOL HCL 50 MG PO TABS: 50.0000 mg | ORAL_TABLET | Freq: Four times a day (QID) | ORAL | 0 refills | Status: DC | PRN

## 2018-06-30 MED FILL — traMADol HCL 50 MG TABS: 50 | 3 days supply | Qty: 15 | Fill #0

## 2018-06-30 MED FILL — SULFAMETHOXAZOLE-TMP DS TAB: 800-160 | 10 days supply | Qty: 40 | Fill #0

## 2018-06-30 NOTE — Discharge Instructions (Addendum)
Follow-up with the breast center on Tuesday.  Return here in the interim for any worsening in your condition.  Use warm compresses over the area.

## 2018-06-30 NOTE — ED Provider Notes (Signed)
[facility] EMERGENCY DEPARTMENT Provider Note   CSN: [id number] Arrival date & time: 06/30/18  0946     History   Chief Complaint Chief Complaint  Patient presents with  . Abscess    HPI JOURDIN GENS is a 48 y.o. female.  HPI Patient presents to the emergency department with an abscess involving the right medial breast.  The patient states that this is been ongoing over the last 4 days.  She states she was seen at urgent care and they got her an appointment with the breast center which is for Tuesday.  The patient states that she wanted to come be checked due to the fact that this had gotten larger over the last 24 hours.  The patient states that nothing seems to make the condition better or worse.  The patient denies chest pain, shortness of breath, headache,blurred vision, neck pain, fever, weakness, numbness, dizziness, anorexia, edema, abdominal pain, nausea, vomiting, diarrhea, rash, back pain, near syncope, or syncope. Past Medical History:  Diagnosis Date  . Abnormal uterine bleeding   . Anemia   . GERD (gastroesophageal reflux disease)    no meds  . Hormone disorder   . Migraine with aura    blurred vision    Patient Active Problem List   Diagnosis Date Noted  . Status post laparoscopic hysterectomy 09/20/2016  . Irregular menses 04/06/2011    Past Surgical History:  Procedure Laterality Date  . ABDOMINAL HYSTERECTOMY    . CYSTOSCOPY N/A 09/20/2016   Procedure: CYSTOSCOPY possible;  Surgeon: Salvadore Dom, MD;  Location: [facility];  Service: Gynecology;  Laterality: N/A;  possible cysto  . hysteroscopic myomectomy    . LAPAROSCOPIC BILATERAL SALPINGECTOMY Bilateral 09/20/2016   Procedure: LAPAROSCOPIC BILATERAL SALPINGECTOMY;  Surgeon: Salvadore Dom, MD;  Location: [facility];  Service: Gynecology;  Laterality: Bilateral;  . LAPAROSCOPIC HYSTERECTOMY N/A 09/20/2016   Procedure: HYSTERECTOMY TOTAL LAPAROSCOPIC;  Surgeon: Salvadore Dom, MD;   Location: [facility];  Service: Gynecology;  Laterality: N/A;  . TUBAL LIGATION       OB History    Gravida  4   Para  3   Term  2   Preterm  1   AB  1   Living  3     SAB      TAB      Ectopic      Multiple      Live Births  3            Home Medications    Prior to Admission medications   Medication Sig Start Date End Date Taking? Authorizing Provider  ibuprofen (ADVIL,MOTRIN) 800 MG tablet Take 1 tablet (800 mg total) by mouth every 8 (eight) hours as needed. 10/18/16   Salvadore Dom, MD  Multiple Vitamin (MULTIVITAMIN WITH MINERALS) TABS tablet Take 1 tablet by mouth daily.    [provider]    Family History Family History  Problem Relation Age of Onset  . Thyroid disease Mother   . Lupus Maternal Aunt   . Cancer Maternal Aunt   . Diabetes Daughter     Social History Social History   Tobacco Use  . Smoking status: Former Smoker    Types: Cigars, E-cigarettes  . Smokeless tobacco: Never Used  . Tobacco comment: vapor   Substance Use Topics  . Alcohol use: Yes    Alcohol/week: 5.0 standard drinks    Types: 5 Glasses of wine per week  . Drug use:  No     Allergies   Patient has no known allergies.   Review of Systems Review of Systems  All other systems negative except as documented in the HPI. All pertinent positives and negatives as reviewed in the HPI. Physical Exam Updated Vital Signs BP (!) 132/96 (BP Location: Left Arm)   Pulse 76   Temp 98.3 F (36.8 C) (Oral)   Resp 18   Ht 5\' 4"  (1.626 m)   Wt 64.4 kg   LMP 09/11/2016 (Exact Date)   SpO2 98%   BMI 24.37 kg/m   Physical Exam Vitals signs and nursing note reviewed.  Constitutional:      General: She is not in acute distress.    Appearance: She is well-developed.  HENT:     Head: Normocephalic and atraumatic.  Eyes:     Pupils: Pupils are equal, round, and reactive to light.  Pulmonary:     Effort: Pulmonary effort is normal.  Chest:     Breasts:          Right: No mass or nipple discharge.     Skin:    General: Skin is warm and dry.  Neurological:     Mental Status: She is alert and oriented to person, place, and time.      ED Treatments / Results  Labs (all labs ordered are listed, but only abnormal results are displayed) Labs Reviewed - No data to display  EKG None  Radiology No results found.  Procedures Procedures (including critical care time)  Medications Ordered in ED Medications - No data to display   Initial Impression / Assessment and Plan / ED Course  I have reviewed the triage vital signs and the nursing notes.  Pertinent labs & imaging results that were available during my care of the patient were reviewed by me and considered in my medical decision making (see chart for details).     I had a discussion with the patient about the area and I advised her that I felt like we could do 1 of 2 things we could do an I&D of the area and have her follow-up with general surgery.  Or we could allow her to go to the breast center on Tuesday as she is scheduled to do with antibiotics and arm compresses over the area with strict return precautions.  The patient was given both options and she has decided she would like to go to the breast center to ensure that this is not another issue such as a cystic area that would need to be removed.  I advised her that she would need to return here in the interim for any worsening in her condition.  This shared decision making was agreed upon by the patient and myself.  I will give her antibiotics Bactrim 2 p.o. twice daily start.  The patient agrees the plan and all questions were answered. Final Clinical Impressions(s) / ED Diagnoses   Final diagnoses:  None    ED Discharge Orders    None       Dalia Heading, Hershal Coria 06/30/18 1056    Jola Schmidt, MD 06/30/18 1520

## 2018-06-30 NOTE — ED Notes (Signed)
ED Provider at bedside. 

## 2018-06-30 NOTE — ED Triage Notes (Addendum)
Oval bump on right breast x1 year.  pmd told her not to worry about it since it was not causing any problems.  Started hurting and growing 2 days ago. Was seen at [facility] yesterday and has appt at breast center next week but pt sts she cannot wait.

## 2018-07-04 ENCOUNTER — Ambulatory Visit
Admission: RE | Admit: 2018-07-04 | Discharge: 2018-07-04 | Disposition: A | Discharge status: Home / Self Care | Payer: Self-pay | Source: Ambulatory Visit | Attending: Obstetrics and Gynecology | Admitting: Obstetrics and Gynecology

## 2018-07-04 ENCOUNTER — Other Ambulatory Visit (HOSPITAL_COMMUNITY): Payer: Self-pay | Admitting: Obstetrics and Gynecology

## 2018-07-04 ENCOUNTER — Encounter (HOSPITAL_COMMUNITY): Payer: Self-pay

## 2018-07-04 ENCOUNTER — Encounter (HOSPITAL_COMMUNITY): Payer: Self-pay | Admitting: *Deleted

## 2018-07-04 ENCOUNTER — Ambulatory Visit (HOSPITAL_COMMUNITY)
Admission: RE | Admit: 2018-07-04 | Discharge: 2018-07-04 | Disposition: A | Discharge status: Home / Self Care | Payer: Self-pay | Source: Ambulatory Visit | Attending: Obstetrics and Gynecology | Admitting: Obstetrics and Gynecology

## 2018-07-04 VITALS — BP 104/64 | Ht 64.0 in | Wt 145.0 lb

## 2018-07-04 DIAGNOSIS — Z1239 Encounter for other screening for malignant neoplasm of breast: Secondary | ICD-10-CM

## 2018-07-04 DIAGNOSIS — N6310 Unspecified lump in the right breast, unspecified quadrant: Secondary | ICD-10-CM

## 2018-07-04 DIAGNOSIS — N644 Mastodynia: Secondary | ICD-10-CM

## 2018-07-04 DIAGNOSIS — N611 Abscess of the breast and nipple: Secondary | ICD-10-CM

## 2018-07-04 NOTE — Patient Instructions (Signed)
Explained breast self awareness with Mauri Brooklyn. Patient did not need a Pap smear today due to last Pap smear and HPV typing was 08/24/2016 and patient has a history of a hysterectomy for benign reasons. Let patient know that she doesn't need any further Pap smears due to her history of a hysterectomy for benign reasons. Referred patient to the [facility] for a diagnostic mammogram and right breast ultrasound. Appointment scheduled for Tuesday, July 04, 2018 at 0930. Patient aware of appointment and will be there. Discussed smoking cessation with patient. Referred to the [facility] Quitline and gave resources to the free smoking cessation classes at [facility]. Mauri Brooklyn verbalized understanding.  Trevonte Ashkar, Arvil Chaco, RN 8:05 AM

## 2018-07-04 NOTE — Progress Notes (Signed)
Complaints of a right breast lump x 1 year that didn't bother her and since last Wednesday tripled in size and became painful. Patient stated she is not sure if it is the same lump or a new lump. Patient states the pain is constant. Patient rated the pain at a 10 out of 10. Patient stated the area is reddened with an area of bloody to brownish colored discharge out of the lump.  Pap Smear: Pap smear not completed today. Last Pap smear was 08/24/2016 at [facility] and normal with negative HPV. Per patient has a history of an abnormal Pap smear 10 or more years ago that a colposcopy was completed for follow-up. Patient states that all Pap smears have been normal since colposcopy and that she has had at least three normal Pap smears since colposcopy. Patient has a history of a hysterectomy in March 2018 due to fibroids. Patient no longer needs Pap smears due to her history of a hysterectomy for benign reasons per BCCCP and ACOG guidelines. Last Pap smear result is in Epic.  Physical exam: Breasts Breasts symmetrical. No skin abnormalities left breast. Right breast reddened between 3-5 o'clock where lump is located. No nipple retraction right breast. Left nipple slightly inverted that per patient is normal for her. No nipple discharge bilateral breasts. Bloody to brownish discharge observed at open area within lump that had a pus like appearance. No lymphadenopathy. No lumps palpated bilateral breasts. Complaints of pain when palpated lump. Referred patient to the [facility] for a diagnostic mammogram and right breast ultrasound. Appointment scheduled for Tuesday, July 04, 2018 at 0930.        Pelvic/Bimanual No Pap smear completed today since last Pap smear and HPV typing was 08/24/2016 and patient has a history of a hysterectomy for benign reasons. Pap smear not indicated per BCCCP guidelines.   Smoking History: Patient quit smoking 09/26/2017 and continues to vape at  times. Discussed smoking cessation with patient. Referred to the [facility] Quitline and gave resources to the free smoking cessation classes at [facility].  Patient Navigation: Patient education provided. Access to services provided for patient through BCCCP program.   Breast and Cervical Cancer Risk Assessment: Patient has no family history of breast cancer, known genetic mutations, or radiation treatment to the chest before age 67. Per patient has a history of cervical dysplasia. Patient has no history of being immunocompromised or DES exposure in-utero.  Risk Assessment    Risk Scores      07/04/2018   Last edited by: Armond Hang, LPN   5-year risk: 0.7 %   Lifetime risk: 7.2 %

## 2018-07-09 LAB — AEROBIC/ANAEROBIC CULTURE (SURGICAL/DEEP WOUND)

## 2018-07-09 LAB — AEROBIC/ANAEROBIC CULTURE W GRAM STAIN (SURGICAL/DEEP WOUND)

## 2018-07-10 ENCOUNTER — Inpatient Hospital Stay: Admission: RE | Admit: 2018-07-10 | Payer: Self-pay | Source: Ambulatory Visit

## 2018-07-10 ENCOUNTER — Other Ambulatory Visit: Payer: Self-pay

## 2018-07-19 ENCOUNTER — Ambulatory Visit
Admission: RE | Admit: 2018-07-19 | Discharge: 2018-07-19 | Disposition: A | Discharge status: Home / Self Care | Payer: No Typology Code available for payment source | Source: Ambulatory Visit | Attending: Obstetrics and Gynecology | Admitting: Obstetrics and Gynecology

## 2018-07-19 DIAGNOSIS — N611 Abscess of the breast and nipple: Secondary | ICD-10-CM

## 2018-07-19 DIAGNOSIS — N6310 Unspecified lump in the right breast, unspecified quadrant: Secondary | ICD-10-CM

## 2019-08-24 ENCOUNTER — Other Ambulatory Visit (HOSPITAL_COMMUNITY): Payer: Self-pay | Admitting: Family Medicine

## 2019-08-24 DIAGNOSIS — N6310 Unspecified lump in the right breast, unspecified quadrant: Secondary | ICD-10-CM

## 2020-05-20 ENCOUNTER — Encounter (HOSPITAL_COMMUNITY): Payer: Self-pay | Admitting: Emergency Medicine

## 2020-05-20 ENCOUNTER — Emergency Department (HOSPITAL_COMMUNITY): Payer: HRSA Program

## 2020-05-20 ENCOUNTER — Other Ambulatory Visit: Payer: Self-pay

## 2020-05-20 ENCOUNTER — Inpatient Hospital Stay (HOSPITAL_COMMUNITY)
Admission: EM | Admit: 2020-05-20 | Discharge: 2020-05-24 | DRG: 177 | Disposition: A | Discharge status: Home / Self Care | Payer: HRSA Program | Attending: Internal Medicine | Admitting: Internal Medicine

## 2020-05-20 DIAGNOSIS — Z90710 Acquired absence of both cervix and uterus: Secondary | ICD-10-CM

## 2020-05-20 DIAGNOSIS — R0602 Shortness of breath: Secondary | ICD-10-CM | POA: Diagnosis not present

## 2020-05-20 DIAGNOSIS — U071 COVID-19: Principal | ICD-10-CM

## 2020-05-20 DIAGNOSIS — Z833 Family history of diabetes mellitus: Secondary | ICD-10-CM

## 2020-05-20 DIAGNOSIS — R0902 Hypoxemia: Secondary | ICD-10-CM | POA: Diagnosis not present

## 2020-05-20 DIAGNOSIS — K219 Gastro-esophageal reflux disease without esophagitis: Secondary | ICD-10-CM | POA: Diagnosis present

## 2020-05-20 DIAGNOSIS — G43909 Migraine, unspecified, not intractable, without status migrainosus: Secondary | ICD-10-CM | POA: Diagnosis present

## 2020-05-20 DIAGNOSIS — Z87891 Personal history of nicotine dependence: Secondary | ICD-10-CM | POA: Diagnosis not present

## 2020-05-20 DIAGNOSIS — E876 Hypokalemia: Secondary | ICD-10-CM | POA: Diagnosis present

## 2020-05-20 DIAGNOSIS — R9431 Abnormal electrocardiogram [ECG] [EKG]: Secondary | ICD-10-CM | POA: Diagnosis not present

## 2020-05-20 DIAGNOSIS — Z79899 Other long term (current) drug therapy: Secondary | ICD-10-CM

## 2020-05-20 DIAGNOSIS — D649 Anemia, unspecified: Secondary | ICD-10-CM | POA: Diagnosis present

## 2020-05-20 DIAGNOSIS — J9601 Acute respiratory failure with hypoxia: Secondary | ICD-10-CM | POA: Diagnosis present

## 2020-05-20 DIAGNOSIS — J1282 Pneumonia due to coronavirus disease 2019: Secondary | ICD-10-CM | POA: Diagnosis present

## 2020-05-20 DIAGNOSIS — R001 Bradycardia, unspecified: Secondary | ICD-10-CM | POA: Diagnosis present

## 2020-05-20 LAB — BASIC METABOLIC PANEL
Anion gap: 13 (ref 5–15)
BUN: 9 mg/dL (ref 6–20)
CO2: 30 mmol/L (ref 22–32)
Calcium: 9.2 mg/dL (ref 8.9–10.3)
Chloride: 99 mmol/L (ref 98–111)
Creatinine, Ser: 0.88 mg/dL (ref 0.44–1.00)
GFR, Estimated: >60 mL/min (ref >60)
Glucose, Bld: 101 mg/dL — ABNORMAL HIGH (ref 70–99)
Potassium: 3.5 mmol/L (ref 3.5–5.1)
Sodium: 142 mmol/L (ref 135–145)

## 2020-05-20 LAB — HIV ANTIBODY (ROUTINE TESTING W REFLEX): HIV Screen 4th Generation wRfx: NONREACTIVE

## 2020-05-20 LAB — RESPIRATORY PANEL BY RT PCR (FLU A&B, COVID)
Influenza A by PCR: NEGATIVE
Influenza B by PCR: NEGATIVE
SARS Coronavirus 2 by RT PCR: POSITIVE — AB

## 2020-05-20 LAB — CBC
HCT: 40.1 % (ref 36.0–46.0)
Hemoglobin: 13.1 g/dL (ref 12.0–15.0)
MCH: 31.3 pg (ref 26.0–34.0)
MCHC: 32.7 g/dL (ref 30.0–36.0)
MCV: 95.9 fL (ref 80.0–100.0)
Platelets: 392 10*3/uL (ref 150–400)
RBC: 4.18 MIL/uL (ref 3.87–5.11)
RDW: 12.3 % (ref 11.5–15.5)
WBC: 6.2 10*3/uL (ref 4.0–10.5)
nRBC: 0 % (ref 0.0–0.2)

## 2020-05-20 LAB — FIBRINOGEN: Fibrinogen: 781 mg/dL — ABNORMAL HIGH (ref 210–475)

## 2020-05-20 LAB — LACTIC ACID, PLASMA
Lactic Acid, Venous: 1.2 mmol/L (ref 0.5–1.9)
Lactic Acid, Venous: 1 mmol/L (ref 0.5–1.9)

## 2020-05-20 LAB — I-STAT BETA HCG BLOOD, ED (MC, WL, AP ONLY): I-stat hCG, quantitative: <5 m[IU]/mL (ref <5)

## 2020-05-20 LAB — TRIGLYCERIDES: Triglycerides: 163 mg/dL — ABNORMAL HIGH (ref <150)

## 2020-05-20 LAB — FERRITIN: Ferritin: 640 ng/mL — ABNORMAL HIGH (ref 11–307)

## 2020-05-20 LAB — D-DIMER, QUANTITATIVE: D-Dimer, Quant: 0.73 ug/mL-FEU — ABNORMAL HIGH (ref 0.00–0.50)

## 2020-05-20 LAB — C-REACTIVE PROTEIN: CRP: 10.2 mg/dL — ABNORMAL HIGH (ref <1.0)

## 2020-05-20 LAB — PROCALCITONIN: Procalcitonin: <0.1 ng/mL

## 2020-05-20 LAB — LACTATE DEHYDROGENASE: LDH: 351 U/L — ABNORMAL HIGH (ref 98–192)

## 2020-05-20 MED ORDER — SODIUM CHLORIDE 0.9 % IV SOLN
100.0000 mg | Freq: Every day | INTRAVENOUS | Status: AC
  Administered 2020-05-21 – 2020-05-24 (×4): 100 mg via INTRAVENOUS
  Filled 2020-05-20 (×5): qty 20

## 2020-05-20 MED ORDER — GUAIFENESIN 100 MG/5ML PO SOLN
10.0000 mL | Freq: Once | ORAL | Status: AC
  Administered 2020-05-22: 200 mg via ORAL
  Filled 2020-05-20: qty 10
  Filled 2020-05-20: qty 5

## 2020-05-20 MED ORDER — POLYETHYLENE GLYCOL 3350 17 G PO PACK: 17.0000 g | PACK | Freq: Every day | ORAL | Status: DC | PRN

## 2020-05-20 MED ORDER — DEXAMETHASONE 6 MG PO TABS
6.0000 mg | ORAL_TABLET | ORAL | Status: DC
  Administered 2020-05-21 – 2020-05-23 (×3): 6 mg via ORAL
  Filled 2020-05-20 (×3): qty 1

## 2020-05-20 MED ORDER — ENOXAPARIN SODIUM 40 MG/0.4ML ~~LOC~~ SOLN
40.0000 mg | SUBCUTANEOUS | Status: DC
  Administered 2020-05-21 – 2020-05-23 (×3): 40 mg via SUBCUTANEOUS
  Filled 2020-05-20 (×4): qty 0.4

## 2020-05-20 MED ORDER — HYDROCOD POLST-CPM POLST ER 10-8 MG/5ML PO SUER
5.0000 mL | Freq: Two times a day (BID) | ORAL | Status: DC | PRN
  Administered 2020-05-21: 5 mL via ORAL
  Filled 2020-05-20: qty 5

## 2020-05-20 MED ORDER — SODIUM CHLORIDE 0.9 % IV SOLN
200.0000 mg | Freq: Once | INTRAVENOUS | Status: AC
  Administered 2020-05-20: 200 mg via INTRAVENOUS
  Filled 2020-05-20: qty 40

## 2020-05-20 MED ORDER — ACETAMINOPHEN 325 MG PO TABS: 650.0000 mg | ORAL_TABLET | Freq: Four times a day (QID) | ORAL | Status: DC | PRN

## 2020-05-20 MED ORDER — GUAIFENESIN-DM 100-10 MG/5ML PO SYRP
10.0000 mL | ORAL_SOLUTION | ORAL | Status: DC | PRN
  Administered 2020-05-21 – 2020-05-23 (×5): 10 mL via ORAL
  Filled 2020-05-20 (×5): qty 10

## 2020-05-20 MED ORDER — DEXAMETHASONE SODIUM PHOSPHATE 10 MG/ML IJ SOLN
8.0000 mg | Freq: Once | INTRAMUSCULAR | Status: AC
  Administered 2020-05-20: 8 mg via INTRAVENOUS
  Filled 2020-05-20: qty 1

## 2020-05-20 MED ORDER — SODIUM CHLORIDE 0.9% FLUSH
3.0000 mL | Freq: Two times a day (BID) | INTRAVENOUS | Status: DC
  Administered 2020-05-21 – 2020-05-24 (×7): 3 mL via INTRAVENOUS

## 2020-05-20 NOTE — ED Provider Notes (Signed)
[facility] EMERGENCY DEPARTMENT Provider Note   CSN: [id number] Arrival date & time: 05/20/20  1710     History Chief Complaint  Patient presents with  . Shortness of Breath    Tammy Colon is a 49 y.o. female.  HPI   Patient is a 49 year old female with a medical history as noted below.  She states 8 days ago she started experiencing her own, body aches, fevers, chills, productive cough with yellow phlegm.  About 3 to 4 days ago she started experiencing shortness of breath that worsens with exertion.  She has not been vaccinated for COVID-19 and notes multiple people in her home who are positive for COVID-19.  Denies chest pain, vomiting, urinary changes, leg swelling, calf pain, loss of taste or smell, history of blood clot.     Past Medical History:  Diagnosis Date  . Abnormal uterine bleeding   . Anemia   . GERD (gastroesophageal reflux disease)    no meds  . Hormone disorder   . Migraine with aura    blurred vision    Patient Active Problem List   Diagnosis Date Noted  . Status post laparoscopic hysterectomy 09/20/2016  . Irregular menses 04/06/2011    Past Surgical History:  Procedure Laterality Date  . ABDOMINAL HYSTERECTOMY    . CYSTOSCOPY N/A 09/20/2016   Procedure: CYSTOSCOPY possible;  Surgeon: Salvadore Dom, MD;  Location: [facility];  Service: Gynecology;  Laterality: N/A;  possible cysto  . hysteroscopic myomectomy    . LAPAROSCOPIC BILATERAL SALPINGECTOMY Bilateral 09/20/2016   Procedure: LAPAROSCOPIC BILATERAL SALPINGECTOMY;  Surgeon: Salvadore Dom, MD;  Location: [facility];  Service: Gynecology;  Laterality: Bilateral;  . LAPAROSCOPIC HYSTERECTOMY N/A 09/20/2016   Procedure: HYSTERECTOMY TOTAL LAPAROSCOPIC;  Surgeon: Salvadore Dom, MD;  Location: [facility];  Service: Gynecology;  Laterality: N/A;  . TUBAL LIGATION       OB History    Gravida  4   Para  3   Term  2   Preterm  1   AB  1   Living  3     SAB        TAB      Ectopic      Multiple      Live Births  3           Family History  Problem Relation Age of Onset  . Lupus Maternal Aunt   . Diabetes Daughter     Social History   Tobacco Use  . Smoking status: Former Smoker    Types: Cigars, E-cigarettes    Quit date: 09/26/2017    Years since quitting: 2.6  . Smokeless tobacco: Never Used  . Tobacco comment: vapor   Vaping Use  . Vaping Use: Some days  . Substances: Nicotine, Flavoring  . Devices: blu  Substance Use Topics  . Alcohol use: Yes    Alcohol/week: 5.0 standard drinks    Types: 5 Glasses of wine per week  . Drug use: No    Home Medications Prior to Admission medications   Medication Sig Start Date End Date Taking? Authorizing Provider  ibuprofen (ADVIL,MOTRIN) 800 MG tablet Take 1 tablet (800 mg total) by mouth every 8 (eight) hours as needed. 10/18/16   Salvadore Dom, MD  Multiple Vitamin (MULTIVITAMIN WITH MINERALS) TABS tablet Take 1 tablet by mouth daily.    [provider]  traMADol (ULTRAM) 50 MG tablet Take 1 tablet (50 mg total) by mouth every  6 (six) hours as needed for severe pain. Patient not taking: Reported on 07/04/2018 06/30/18   Dalia Heading, PA-C    Allergies    Patient has no known allergies.  Review of Systems   Review of Systems  All other systems reviewed and are negative. Ten systems reviewed and are negative for acute change, except as noted in the HPI.    Physical Exam Updated Vital Signs BP 112/72   Pulse 74   Temp 99.5 F (37.5 C) (Oral)   Resp 15   Ht 5\' 4"  (1.626 m)   Wt 61.2 kg   LMP 09/11/2016 (Exact Date)   SpO2 100%   BMI 23.17 kg/m   Physical Exam Vitals and nursing note reviewed.  Constitutional:      General: She is in acute distress.     Appearance: Normal appearance. She is well-developed and normal weight. She is not ill-appearing, toxic-appearing or diaphoretic.     Interventions: She is not intubated. HENT:     Head:  Normocephalic and atraumatic.     Right Ear: External ear normal.     Left Ear: External ear normal.     Nose: Nose normal.     Mouth/Throat:     Mouth: Mucous membranes are moist.     Pharynx: Oropharynx is clear. No oropharyngeal exudate or posterior oropharyngeal erythema.  Eyes:     Extraocular Movements: Extraocular movements intact.  Cardiovascular:     Rate and Rhythm: Normal rate and regular rhythm.     Pulses: Normal pulses.     Heart sounds: Normal heart sounds. No murmur heard.  No friction rub. No gallop.   Pulmonary:     Effort: Tachypnea and respiratory distress present. No bradypnea or accessory muscle usage. She is not intubated.     Breath sounds: Normal breath sounds. No stridor. No decreased breath sounds, wheezing, rhonchi or rales.     Comments: Patient saturating around 100% on 4 L via nasal cannula.  With oxygen removed and patient standing, she desaturates to 85 to 88% on room air. Abdominal:     General: Abdomen is flat.     Palpations: Abdomen is soft.     Tenderness: There is no abdominal tenderness.  Musculoskeletal:        General: Normal range of motion.     Cervical back: Normal range of motion and neck supple. No tenderness.     Right lower leg: No edema.     Left lower leg: No edema.  Skin:    General: Skin is warm and dry.  Neurological:     General: No focal deficit present.     Mental Status: She is alert and oriented to person, place, and time.  Psychiatric:        Mood and Affect: Mood normal.        Behavior: Behavior normal.    ED Results / Procedures / Treatments   Labs (all labs ordered are listed, but only abnormal results are displayed) Labs Reviewed  RESPIRATORY PANEL BY RT PCR (FLU A&B, COVID) - Abnormal; Notable for the following components:      Result Value   SARS Coronavirus 2 by RT PCR POSITIVE (*)    All other components within normal limits  BASIC METABOLIC PANEL - Abnormal; Notable for the following components:    Glucose, Bld 101 (*)    All other components within normal limits  D-DIMER, QUANTITATIVE (NOT AT [facility]) - Abnormal; Notable for the following components:  D-Dimer, Quant 0.73 (*)    All other components within normal limits  LACTATE DEHYDROGENASE - Abnormal; Notable for the following components:   LDH 351 (*)    All other components within normal limits  TRIGLYCERIDES - Abnormal; Notable for the following components:   Triglycerides 163 (*)    All other components within normal limits  FIBRINOGEN - Abnormal; Notable for the following components:   Fibrinogen 781 (*)    All other components within normal limits  CULTURE, BLOOD (ROUTINE X 2)  CULTURE, BLOOD (ROUTINE X 2)  CBC  LACTIC ACID, PLASMA  LACTIC ACID, PLASMA  PROCALCITONIN  FERRITIN  C-REACTIVE PROTEIN  HIV ANTIBODY (ROUTINE TESTING W REFLEX)  CBC WITH DIFFERENTIAL/PLATELET  COMPREHENSIVE METABOLIC PANEL  C-REACTIVE PROTEIN  FERRITIN  MAGNESIUM  D-DIMER, QUANTITATIVE (NOT AT [facility])  PHOSPHORUS  I-STAT BETA HCG BLOOD, ED ([facility], [facility], [facility] ONLY)    EKG None  Radiology DG Chest Portable 1 View  Result Date: 05/20/2020 CLINICAL DATA:  49 year old female with shortness of breath. EXAM: PORTABLE CHEST 1 VIEW COMPARISON:  None. FINDINGS: Faint bilateral pulmonary opacities most consistent with multifocal pneumonia, likely viral or atypical in etiology including COVID-19. Clinical correlation is recommended. There is no pleural effusion pneumothorax. The cardiac silhouette is within limits. No acute osseous pathology. IMPRESSION: Multifocal pneumonia. Electronically Signed   By: Anner Crete M.D.   On: 05/20/2020 17:52   Procedures Procedures (including critical care time)  Medications Ordered in ED Medications  guaiFENesin (ROBITUSSIN) 100 MG/5ML solution 200 mg (has no administration in time range)  enoxaparin (LOVENOX) injection 40 mg (has no administration in time range)  sodium chloride flush (NS) 0.9 % injection 3  mL (has no administration in time range)  remdesivir 200 mg in sodium chloride 0.9% 250 mL IVPB (has no administration in time range)    Followed by  remdesivir 100 mg in sodium chloride 0.9 % 100 mL IVPB (has no administration in time range)  dexamethasone (DECADRON) tablet 6 mg (has no administration in time range)  guaiFENesin-dextromethorphan (ROBITUSSIN DM) 100-10 MG/5ML syrup 10 mL (has no administration in time range)  chlorpheniramine-HYDROcodone (TUSSIONEX) 10-8 MG/5ML suspension 5 mL (has no administration in time range)  acetaminophen (TYLENOL) tablet 650 mg (has no administration in time range)  polyethylene glycol (MIRALAX / GLYCOLAX) packet 17 g (has no administration in time range)  dexamethasone (DECADRON) injection 8 mg (8 mg Intravenous Given 05/20/20 2017)    ED Course  I have reviewed the triage vital signs and the nursing notes.  Pertinent labs & imaging results that were available during my care of the patient were reviewed by me and considered in my medical decision making (see chart for details).  Clinical Course as of May 20 2118  Tue May 20, 2020  1908 Patient saturating at 100% on 4 L via nasal cannula. Oxygen removed and patient ambulates in place and saturations dropped to mid 80s.   [LJ]  1937 SARS Coronavirus 2 by RT PCR(!): POSITIVE [LJ]    Clinical Course User Index [LJ] Rayna Sexton, PA-C   MDM Rules/Calculators/A&P                          Patient is a 49 year old female who presents to the emergency department due to a multitude of symptoms consistent with COVID-19.  Worsening shortness of breath the past 3 to 4 days.  Patient was noted to be hypoxic with EMS as well as here in  the emergency department.  She has not been vaccinated for COVID-19.  CBC and BMP are reassuring.  Her COVID-19 test is positive.  Additional Covid markers have been ordered.  Patient given Decadron as well as Robitussin.  Will admit for further management.  This was  discussed with the patient and she is amenable.  Final Clinical Impression(s) / ED Diagnoses Final diagnoses:  COVID-19  Hypoxia    Rx / DC Orders ED Discharge Orders    None       Rayna Sexton, PA-C 05/20/20 2120    Veryl Speak, MD 05/21/20 1536

## 2020-05-20 NOTE — ED Triage Notes (Signed)
Patient arrives to ED from home with complaints of SOB, cough, sore throat, and chills x1.5 weeks. Pt states her SOB has gotten worse, now having DOE. Two family members recently tested positive for COVID.

## 2020-05-20 NOTE — H&P (Signed)
History and Physical   Tammy Colon:[id number] DOB: 1971-05-24 DOA: 05/20/2020  PCP: Patient, No Pcp Per   Patient coming from: Home  Chief Complaint: Progressive shortness of breath  HPI: Tammy Colon is a 49 y.o. female with medical history significant of prior hysterectomy, GERD, migraines who presents with 8 days of viral illness with now worsening shortness of breath and worsening dyspnea on exertion. Patient states that she has been having 8 days of shortness of breath, cough, sore throat, chills, fatigue. Most of her symptoms had begun to improve, but her shortness of breath and dyspnea on exertion has been worsening. She has 2 family members who are Covid positive. She is not vaccinated against COVID-19. She denies chest pain, abdominal pain, constipation, diarrhea.  ED Course: Vital signs in the ED significant for desaturation to the mid 80s if she attempts to stand or do anything strenuous. Lab work showed normal BMP and CBC. Respiratory panel was positive for Covid. Inflammatory markers including CRP, fibrinogen, TG, ferritin, LDH, procalcitonin, D-dimer, lactic acid ordered in ED but not yet obtained. Blood cultures also ordered and are pending. Patient received 1 dose of Decadron in ED.  Review of Systems: As per HPI otherwise all other systems reviewed and are negative.  Past Medical History:  Diagnosis Date  . Abnormal uterine bleeding   . Anemia   . GERD (gastroesophageal reflux disease)    no meds  . Hormone disorder   . Migraine with aura    blurred vision    Past Surgical History:  Procedure Laterality Date  . ABDOMINAL HYSTERECTOMY    . CYSTOSCOPY N/A 09/20/2016   Procedure: CYSTOSCOPY possible;  Surgeon: Salvadore Dom, MD;  Location: [facility];  Service: Gynecology;  Laterality: N/A;  possible cysto  . hysteroscopic myomectomy    . LAPAROSCOPIC BILATERAL SALPINGECTOMY Bilateral 09/20/2016   Procedure: LAPAROSCOPIC BILATERAL SALPINGECTOMY;   Surgeon: Salvadore Dom, MD;  Location: [facility];  Service: Gynecology;  Laterality: Bilateral;  . LAPAROSCOPIC HYSTERECTOMY N/A 09/20/2016   Procedure: HYSTERECTOMY TOTAL LAPAROSCOPIC;  Surgeon: Salvadore Dom, MD;  Location: [facility];  Service: Gynecology;  Laterality: N/A;  . TUBAL LIGATION      Social History  reports that she quit smoking about 2 years ago. Her smoking use included cigars and e-cigarettes. She has never used smokeless tobacco. She reports current alcohol use of about 5.0 standard drinks of alcohol per week. She reports that she does not use drugs.  No Known Allergies  Family History  Problem Relation Age of Onset  . Lupus Maternal Aunt   . Diabetes Daughter   Reviewed on admission  Prior to Admission medications   Medication Sig Start Date End Date Taking? Authorizing Provider  ibuprofen (ADVIL,MOTRIN) 800 MG tablet Take 1 tablet (800 mg total) by mouth every 8 (eight) hours as needed. 10/18/16   Salvadore Dom, MD  Multiple Vitamin (MULTIVITAMIN WITH MINERALS) TABS tablet Take 1 tablet by mouth daily.    [provider]  traMADol (ULTRAM) 50 MG tablet Take 1 tablet (50 mg total) by mouth every 6 (six) hours as needed for severe pain. Patient not taking: Reported on 07/04/2018 06/30/18   Dalia Heading, PA-C    Physical Exam: Vitals:   05/20/20 1830 05/20/20 1845 05/20/20 1849 05/20/20 2010  BP: 133/78 112/72 112/72 108/75  Pulse: 60 (!) 58 74 65  Resp:  18 15 (!) 31  Temp:      TempSrc:  SpO2: 100% 100% 100% 100%  Weight:      Height:        Physical Exam Constitutional:      General: She is not in acute distress.    Appearance: Normal appearance.  HENT:     Head: Normocephalic and atraumatic.     Mouth/Throat:     Mouth: Mucous membranes are moist.     Pharynx: Oropharynx is clear.  Eyes:     Extraocular Movements: Extraocular movements intact.     Pupils: Pupils are equal, round, and reactive to light.  Cardiovascular:      Rate and Rhythm: Normal rate and regular rhythm.     Pulses: Normal pulses.     Heart sounds: Normal heart sounds.  Pulmonary:     Effort: Pulmonary effort is normal. No respiratory distress.     Comments:  Bilateral crackles Abdominal:     General: Bowel sounds are normal. There is no distension.     Palpations: Abdomen is soft.     Tenderness: There is no abdominal tenderness.  Musculoskeletal:        General: No swelling or deformity.  Skin:    General: Skin is warm and dry.  Neurological:     General: No focal deficit present.     Mental Status: Mental status is at baseline.    Labs on Admission: I have personally reviewed following labs and imaging studies  CBC: Recent Labs  Lab 05/20/20 1820  WBC 6.2  HGB 13.1  HCT 40.1  MCV 95.9  PLT 595    Basic Metabolic Panel: Recent Labs  Lab 05/20/20 1820  NA 142  K 3.5  CL 99  CO2 30  GLUCOSE 101*  BUN 9  CREATININE 0.88  CALCIUM 9.2    GFR: Estimated Creatinine Clearance: 69 mL/min (by C-G formula based on SCr of 0.88 mg/dL).  Liver Function Tests: No results for input(s): AST, ALT, ALKPHOS, BILITOT, PROT, ALBUMIN in the last 168 hours.  Urine analysis: No results found for: COLORURINE, APPEARANCEUR, LABSPEC, [place], GLUCOSEU, HGBUR, BILIRUBINUR, KETONESUR, PROTEINUR, UROBILINOGEN, NITRITE, LEUKOCYTESUR  Radiological Exams on Admission: DG Chest Portable 1 View  Result Date: 05/20/2020 CLINICAL DATA:  49 year old female with shortness of breath. EXAM: PORTABLE CHEST 1 VIEW COMPARISON:  None. FINDINGS: Faint bilateral pulmonary opacities most consistent with multifocal pneumonia, likely viral or atypical in etiology including COVID-19. Clinical correlation is recommended. There is no pleural effusion pneumothorax. The cardiac silhouette is within limits. No acute osseous pathology. IMPRESSION: Multifocal pneumonia. Electronically Signed   By: Anner Crete M.D.   On: 05/20/2020 17:52   EKG: Not yet  obtained.  Assessment/Plan Active Problems:   Pneumonia due to COVID-19 virus  Pneumonia due to COVID-19 > About 8 days of shortness of breath, cough, sore throat, chills with known family exposures. > Worsening shortness of breath and DOE now requiring 2 to 4 L with any activity in ED. > Chest x-ray with bilateral multifocal infiltrates, Covid positive (this is first Covid test since falling ill) - Start remdesivir and continue Decadron - Supplemental oxygen as needed - Flutter valve and Covid protocols for proning - Mucinex as needed - Daily CBC, CMP, ferritin, CRP, D-dimer, magnesium, Phos  DVT prophylaxis: Lovenox  Code Status:   Full  Family Communication: None on admission Disposition Plan:   Patient is from:  Home  Anticipated DC to:  Home  Anticipated DC date:  Pending clinical course  Anticipated DC barriers: None  Consults called:  None  Admission status:  Inpatient, telemetry   Severity of Illness: The appropriate patient status for this patient is INPATIENT. Inpatient status is judged to be reasonable and necessary in order to provide the required intensity of service to ensure the patient's safety. The patient's presenting symptoms, physical exam findings, and initial radiographic and laboratory data in the context of their chronic comorbidities is felt to place them at high risk for further clinical deterioration. Furthermore, it is not anticipated that the patient will be medically stable for discharge from the hospital within 2 midnights of admission. The following factors support the patient status of inpatient.   " The patient's presenting symptoms include shortness of breath, cough, sore throat, chills, worsening dyspnea on exertion. " The worrisome physical exam findings include hypoxia with activity. " The initial radiographic and laboratory data are worrisome because of bilateral multifocal pneumonia. " The chronic co-morbidities include GERD, migraine,  anemia.   * I certify that at the point of admission it is my clinical judgment that the patient will require inpatient hospital care spanning beyond 2 midnights from the point of admission due to high intensity of service, high risk for further deterioration and high frequency of surveillance required.Marcelyn Bruins MD [facility]  How to contact the [facility] Attending or Consulting provider [place] or covering provider during after hours [place], for this patient?   1. Check the care team in [facility] and look for a) attending/consulting [facility] provider listed and b) the [facility] team listed 2. Log into [email or web address] and use [place] universal password to access. If you do not have the password, please contact the hospital operator. 3. Locate the [facility] provider you are looking for under [facility] and page to a number that you can be directly reached. 4. If you still have difficulty reaching the provider, please page the [facility] (Director on Call) for the Hospitalists listed on amion for assistance.  05/20/2020, 8:31 PM

## 2020-05-21 DIAGNOSIS — R0902 Hypoxemia: Secondary | ICD-10-CM

## 2020-05-21 LAB — CBC WITH DIFFERENTIAL/PLATELET
Abs Immature Granulocytes: 0.06 10*3/uL (ref 0.00–0.07)
Basophils Absolute: 0 10*3/uL (ref 0.0–0.1)
Basophils Relative: 0 %
Eosinophils Absolute: 0 10*3/uL (ref 0.0–0.5)
Eosinophils Relative: 0 %
HCT: 41.4 % (ref 36.0–46.0)
Hemoglobin: 13.4 g/dL (ref 12.0–15.0)
Immature Granulocytes: 1 %
Lymphocytes Relative: 12 %
Lymphs Abs: 0.5 10*3/uL — ABNORMAL LOW (ref 0.7–4.0)
MCH: 30.7 pg (ref 26.0–34.0)
MCHC: 32.4 g/dL (ref 30.0–36.0)
MCV: 94.7 fL (ref 80.0–100.0)
Monocytes Absolute: 0.1 10*3/uL (ref 0.1–1.0)
Monocytes Relative: 3 %
Neutro Abs: 3.6 10*3/uL (ref 1.7–7.7)
Neutrophils Relative %: 84 %
Platelets: 412 10*3/uL — ABNORMAL HIGH (ref 150–400)
RBC: 4.37 MIL/uL (ref 3.87–5.11)
RDW: 12.4 % (ref 11.5–15.5)
WBC: 4.2 10*3/uL (ref 4.0–10.5)
nRBC: 0 % (ref 0.0–0.2)

## 2020-05-21 LAB — COMPREHENSIVE METABOLIC PANEL
ALT: 24 U/L (ref 0–44)
AST: 36 U/L (ref 15–41)
Albumin: 3 g/dL — ABNORMAL LOW (ref 3.5–5.0)
Alkaline Phosphatase: 72 U/L (ref 38–126)
Anion gap: 16 — ABNORMAL HIGH (ref 5–15)
BUN: 9 mg/dL (ref 6–20)
CO2: 27 mmol/L (ref 22–32)
Calcium: 9.1 mg/dL (ref 8.9–10.3)
Chloride: 97 mmol/L — ABNORMAL LOW (ref 98–111)
Creatinine, Ser: 0.67 mg/dL (ref 0.44–1.00)
GFR, Estimated: >60 mL/min (ref >60)
Glucose, Bld: 190 mg/dL — ABNORMAL HIGH (ref 70–99)
Potassium: 3.4 mmol/L — ABNORMAL LOW (ref 3.5–5.1)
Sodium: 140 mmol/L (ref 135–145)
Total Bilirubin: 0.6 mg/dL (ref 0.3–1.2)
Total Protein: 7.6 g/dL (ref 6.5–8.1)

## 2020-05-21 LAB — PHOSPHORUS: Phosphorus: 2.4 mg/dL — ABNORMAL LOW (ref 2.5–4.6)

## 2020-05-21 LAB — FERRITIN: Ferritin: 607 ng/mL — ABNORMAL HIGH (ref 11–307)

## 2020-05-21 LAB — D-DIMER, QUANTITATIVE: D-Dimer, Quant: 0.93 ug/mL-FEU — ABNORMAL HIGH (ref 0.00–0.50)

## 2020-05-21 LAB — MAGNESIUM: Magnesium: 2.5 mg/dL — ABNORMAL HIGH (ref 1.7–2.4)

## 2020-05-21 LAB — C-REACTIVE PROTEIN: CRP: 9.2 mg/dL — ABNORMAL HIGH (ref <1.0)

## 2020-05-21 MED ORDER — POTASSIUM CHLORIDE CRYS ER 20 MEQ PO TBCR
40.0000 meq | EXTENDED_RELEASE_TABLET | Freq: Once | ORAL | Status: AC
  Administered 2020-05-21: 40 meq via ORAL
  Filled 2020-05-21: qty 2

## 2020-05-21 NOTE — Progress Notes (Signed)
PROGRESS NOTE    Tammy Colon  YYT:[id number] DOB: 1971/06/17 DOA: 05/20/2020 PCP: Patient, No Pcp Per    Brief Narrative:48 y.o. female with medical history significant of prior hysterectomy, GERD, migraines who presents with 8 days of viral illness with now worsening shortness of breath and worsening dyspnea on exertion. Patient states that she has been having 8 days of shortness of breath, cough, sore throat, chills, fatigue. Most of her symptoms had begun to improve, but her shortness of breath and dyspnea on exertion has been worsening. She has 2 family members who are Covid positive. She is not vaccinated against COVID-19. She denies chest pain, abdominal pain, constipation, diarrhea.  ED Course: Vital signs in the ED significant for desaturation to the mid 80s if she attempts to stand or do anything strenuous. Lab work showed normal BMP and CBC. Respiratory panel was positive for Covid. Inflammatory markers including CRP, fibrinogen, TG, ferritin, LDH, procalcitonin, D-dimer, lactic acid ordered in ED but not yet obtained. Blood cultures also ordered and are pending. Patient received 1 dose of Decadron in ED. Assessment & Plan:   Active Problems:   Pneumonia due to COVID-19 virus   #1 Acute hypoxic respiratory failure due to covid pneumonia- Still hypoxic on 4 L cxr b/l multifocal infiltrates  Continue remdesvir  Continue decadron Encourage IS Flutter valve  oob Encourage proning CRP 9.2 from 10.2  #2 Hypokalemia -replete k  Estimated body mass index is 23.17 kg/m as calculated from the following:   Height as of this encounter: 5\' 4"  (1.626 m).   Weight as of this encounter: 61.2 kg.  DVT prophylaxis: lovenox Code Status: full Family Communication:dw patient  Disposition Plan:  Status is: Inpatient  Dispo: The patient is from:home              Anticipated d/c is EX:NTZG              Anticipated d/c date is 2 to 3 days              Patient currently is not  medically stable to d/c.    Consultants: none  Procedures: none Antimicrobials:none Subjective: Resting in chair  Continues to complain of shortness of breath and cough  Objective: Vitals:   05/21/20 0025 05/21/20 0200 05/21/20 0412 05/21/20 0750  BP: 106/75  99/68 105/74  Pulse: (!) 59  60 60  Resp: 18  20 14   Temp: 98.6 F (37 C)  98.1 F (36.7 C) 98 F (36.7 C)  TempSrc: Oral  Oral Oral  SpO2: 100% 98% 100% 91%  Weight:      Height:        Intake/Output Summary (Last 24 hours) at 05/21/2020 0815 Last data filed at 05/21/2020 0300 Gross per 24 hour  Intake 400 ml  Output --  Net 400 ml   Filed Weights   05/20/20 1716  Weight: 61.2 kg    Examination:  General exam: Appears calm and comfortable  Respiratory system: Coarse to auscultation. Respiratory effort normal. Cardiovascular system: S1 & S2 heard, RRR. No JVD, murmurs, rubs, gallops or clicks. No pedal edema. Gastrointestinal system: Abdomen is nondistended, soft and nontender. No organomegaly or masses felt. Normal bowel sounds heard. Central nervous system: Alert and oriented. No focal neurological deficits. Extremities: Symmetric 5 x 5 power. Skin: No rashes, lesions or ulcers Psychiatry: Judgement and insight appear normal. Mood & affect appropriate.     Data Reviewed: I have personally reviewed following labs and imaging studies  CBC:  Recent Labs  Lab 05/20/20 1820 05/21/20 0118  WBC 6.2 4.2  NEUTROABS  --  3.6  HGB 13.1 13.4  HCT 40.1 41.4  MCV 95.9 94.7  PLT 392 371*   Basic Metabolic Panel: Recent Labs  Lab 05/20/20 1820 05/21/20 0118  NA 142 140  K 3.5 3.4*  CL 99 97*  CO2 30 27  GLUCOSE 101* 190*  BUN 9 9  CREATININE 0.88 0.67  CALCIUM 9.2 9.1  MG  --  2.5*  PHOS  --  2.4*   GFR: Estimated Creatinine Clearance: 75.9 mL/min (by C-G formula based on SCr of 0.67 mg/dL). Liver Function Tests: Recent Labs  Lab 05/21/20 0118  AST 36  ALT 24  ALKPHOS 72  BILITOT 0.6   PROT 7.6  ALBUMIN 3.0*   No results for input(s): LIPASE, AMYLASE in the last 168 hours. No results for input(s): AMMONIA in the last 168 hours. Coagulation Profile: No results for input(s): INR, PROTIME in the last 168 hours. Cardiac Enzymes: No results for input(s): CKTOTAL, CKMB, CKMBINDEX, TROPONINI in the last 168 hours. BNP (last 3 results) No results for input(s): PROBNP in the last 8760 hours. HbA1C: No results for input(s): HGBA1C in the last 72 hours. CBG: No results for input(s): GLUCAP in the last 168 hours. Lipid Profile: Recent Labs    05/20/20 1938  TRIG 163*   Thyroid Function Tests: No results for input(s): TSH, T4TOTAL, FREET4, T3FREE, THYROIDAB in the last 72 hours. Anemia Panel: Recent Labs    05/20/20 1938 05/21/20 0118  FERRITIN 640* 607*   Sepsis Labs: Recent Labs  Lab 05/20/20 1938 05/20/20 2026 05/20/20 2138  PROCALCITON <0.10  --   --   LATICACIDVEN  --  1.2 1.0    Recent Results (from the past 240 hour(s))  Respiratory Panel by RT PCR (Flu A&B, Covid) - Nasopharyngeal Swab     Status: Abnormal   Collection Time: 05/20/20  5:22 PM   Specimen: Nasopharyngeal Swab; Nasopharyngeal(NP) swabs in vial transport medium  Result Value Ref Range Status   SARS Coronavirus 2 by RT PCR POSITIVE (A) NEGATIVE Final    Comment: RESULT CALLED TO, READ BACK BY AND VERIFIED WITH: G TATE RN 1933 05/20/20 A BROWNING (NOTE) SARS-CoV-2 target nucleic acids are DETECTED.  SARS-CoV-2 RNA is generally detectable in upper respiratory specimens  during the acute phase of infection. Positive results are indicative of the presence of the identified virus, but do not rule out bacterial infection or co-infection with other pathogens not detected by the test. Clinical correlation with patient history and other diagnostic information is necessary to determine patient infection status. The expected result is Negative.  Fact Sheet for Patients:   PinkCheek.be  Fact Sheet for Healthcare Providers: GravelBags.it  This test is not yet approved or cleared by the [place] FDA and  has been authorized for detection and/or diagnosis of SARS-CoV-2 by FDA under an Emergency Use Authorization (EUA).  This EUA will remain in effect (meaning this test can be  used) for the duration of  the COVID-19 declaration under Section 564(b)(1) of the Act, 21 U.S.C. section 360bbb-3(b)(1), unless the authorization is terminated or revoked sooner.      Influenza A by PCR NEGATIVE NEGATIVE Final   Influenza B by PCR NEGATIVE NEGATIVE Final    Comment: (NOTE) The Xpert Xpress SARS-CoV-2/FLU/RSV assay is intended as an aid in  the diagnosis of influenza from Nasopharyngeal swab specimens and  should not be used as a sole  basis for treatment. Nasal washings and  aspirates are unacceptable for Xpert Xpress SARS-CoV-2/FLU/RSV  testing.  Fact Sheet for Patients: PinkCheek.be  Fact Sheet for Healthcare Providers: GravelBags.it  This test is not yet approved or cleared by the [place] FDA and  has been authorized for detection and/or diagnosis of SARS-CoV-2 by  FDA under an Emergency Use Authorization (EUA). This EUA will remain  in effect (meaning this test can be used) for the duration of the  Covid-19 declaration under Section 564(b)(1) of the Act, 21  U.S.C. section 360bbb-3(b)(1), unless the authorization is  terminated or revoked. Performed at [facility], [place] [address], [place], [place] [address]   Blood Culture (routine x 2)     Status: None (Preliminary result)   Collection Time: 05/20/20  8:05 PM   Specimen: BLOOD  Result Value Ref Range Status   Specimen Description BLOOD RIGHT ANTECUBITAL  Final   Special Requests   Final    BOTTLES DRAWN AEROBIC AND ANAEROBIC Blood Culture adequate volume   Culture    Final    NO GROWTH < 12 HOURS Performed at [facility], [place] [address] [place], [place] [address]    Report Status PENDING  Incomplete         Radiology Studies: DG Chest Portable 1 View  Result Date: 05/20/2020 CLINICAL DATA:  49 year old female with shortness of breath. EXAM: PORTABLE CHEST 1 VIEW COMPARISON:  None. FINDINGS: Faint bilateral pulmonary opacities most consistent with multifocal pneumonia, likely viral or atypical in etiology including COVID-19. Clinical correlation is recommended. There is no pleural effusion pneumothorax. The cardiac silhouette is within limits. No acute osseous pathology. IMPRESSION: Multifocal pneumonia. Electronically Signed   By: Anner Crete M.D.   On: 05/20/2020 17:52        Scheduled Meds: . dexamethasone  6 mg Oral Q24H  . enoxaparin (LOVENOX) injection  40 mg Subcutaneous Q24H  . guaiFENesin  10 mL Oral Once  . sodium chloride flush  3 mL Intravenous Q12H   Continuous Infusions: . remdesivir 100 mg in NS 100 mL       LOS: 1 day     Georgette Shell, MD  05/21/2020, 8:15 AM

## 2020-05-21 NOTE — Plan of Care (Signed)
Patient transferred to unit around 0000. Patient is resting in chair. VSS. Currently on 3L [place]. Call bell within reach. Patient eager to get discharged.   Problem: Education: Goal: Knowledge of risk factors and measures for prevention of condition will improve Outcome: Progressing   Problem: Coping: Goal: Psychosocial and spiritual needs will be supported Outcome: Progressing   Problem: Respiratory: Goal: Will maintain a patent airway Outcome: Progressing Goal: Complications related to the disease process, condition or treatment will be avoided or minimized Outcome: Progressing

## 2020-05-22 ENCOUNTER — Inpatient Hospital Stay (HOSPITAL_COMMUNITY): Payer: HRSA Program

## 2020-05-22 DIAGNOSIS — U071 COVID-19: Secondary | ICD-10-CM

## 2020-05-22 DIAGNOSIS — R9431 Abnormal electrocardiogram [ECG] [EKG]: Secondary | ICD-10-CM

## 2020-05-22 LAB — CBC WITH DIFFERENTIAL/PLATELET
Abs Immature Granulocytes: 0.07 10*3/uL (ref 0.00–0.07)
Basophils Absolute: 0 10*3/uL (ref 0.0–0.1)
Basophils Relative: 0 %
Eosinophils Absolute: 0 10*3/uL (ref 0.0–0.5)
Eosinophils Relative: 0 %
HCT: 38.9 % (ref 36.0–46.0)
Hemoglobin: 12.8 g/dL (ref 12.0–15.0)
Immature Granulocytes: 1 %
Lymphocytes Relative: 10 %
Lymphs Abs: 0.7 10*3/uL (ref 0.7–4.0)
MCH: 30.8 pg (ref 26.0–34.0)
MCHC: 32.9 g/dL (ref 30.0–36.0)
MCV: 93.7 fL (ref 80.0–100.0)
Monocytes Absolute: 0.8 10*3/uL (ref 0.1–1.0)
Monocytes Relative: 10 %
Neutro Abs: 5.7 10*3/uL (ref 1.7–7.7)
Neutrophils Relative %: 79 %
Platelets: 505 10*3/uL — ABNORMAL HIGH (ref 150–400)
RBC: 4.15 MIL/uL (ref 3.87–5.11)
RDW: 12.3 % (ref 11.5–15.5)
WBC: 7.2 10*3/uL (ref 4.0–10.5)
nRBC: 0 % (ref 0.0–0.2)

## 2020-05-22 LAB — D-DIMER, QUANTITATIVE: D-Dimer, Quant: 0.72 ug/mL-FEU — ABNORMAL HIGH (ref 0.00–0.50)

## 2020-05-22 LAB — ECHOCARDIOGRAM COMPLETE
Area-P 1/2: 2.62 cm2
Height: 64 in
S' Lateral: 2.9 cm
Weight: 2160 oz

## 2020-05-22 LAB — COMPREHENSIVE METABOLIC PANEL
ALT: 29 U/L (ref 0–44)
AST: 41 U/L (ref 15–41)
Albumin: 2.7 g/dL — ABNORMAL LOW (ref 3.5–5.0)
Alkaline Phosphatase: 61 U/L (ref 38–126)
Anion gap: 12 (ref 5–15)
BUN: 12 mg/dL (ref 6–20)
CO2: 26 mmol/L (ref 22–32)
Calcium: 9.5 mg/dL (ref 8.9–10.3)
Chloride: 103 mmol/L (ref 98–111)
Creatinine, Ser: 0.67 mg/dL (ref 0.44–1.00)
GFR, Estimated: >60 mL/min (ref >60)
Glucose, Bld: 139 mg/dL — ABNORMAL HIGH (ref 70–99)
Potassium: 3.7 mmol/L (ref 3.5–5.1)
Sodium: 141 mmol/L (ref 135–145)
Total Bilirubin: 0.8 mg/dL (ref 0.3–1.2)
Total Protein: 6.8 g/dL (ref 6.5–8.1)

## 2020-05-22 LAB — PHOSPHORUS: Phosphorus: 1.9 mg/dL — ABNORMAL LOW (ref 2.5–4.6)

## 2020-05-22 LAB — FERRITIN: Ferritin: 515 ng/mL — ABNORMAL HIGH (ref 11–307)

## 2020-05-22 LAB — MAGNESIUM: Magnesium: 2.4 mg/dL (ref 1.7–2.4)

## 2020-05-22 LAB — C-REACTIVE PROTEIN: CRP: 2.5 mg/dL — ABNORMAL HIGH (ref <1.0)

## 2020-05-22 NOTE — Progress Notes (Addendum)
PROGRESS NOTE    Tammy Colon  HYQ:[id number] DOB: 1971-05-30 DOA: 05/20/2020 PCP: Patient, No Pcp Per    Brief Narrative:48 y.o. female with medical history significant of prior hysterectomy, GERD, migraines who presents with 8 days of viral illness with now worsening shortness of breath and worsening dyspnea on exertion. Patient states that she has been having 8 days of shortness of breath, cough, sore throat, chills, fatigue. Most of her symptoms had begun to improve, but her shortness of breath and dyspnea on exertion has been worsening. She has 2 family members who are Covid positive. She is not vaccinated against COVID-19. She denies chest pain, abdominal pain, constipation, diarrhea.  ED Course: Vital signs in the ED significant for desaturation to the mid 80s if she attempts to stand or do anything strenuous. Lab work showed normal BMP and CBC. Respiratory panel was positive for Covid. Inflammatory markers including CRP, fibrinogen, TG, ferritin, LDH, procalcitonin, D-dimer, lactic acid ordered in ED but not yet obtained. Blood cultures also ordered and are pending. Patient received 1 dose of Decadron in ED. Assessment & Plan:   Active Problems:   COVID-19   Hypoxia   #1 Acute hypoxic respiratory failure due to covid pneumonia- Still hypoxic on 4 L cxr b/l multifocal infiltrates  Continue remdesvir  Continue decadron Encourage IS Flutter valve  oob Encourage proning CRP 2.5 from 9.2 from 10.2 D-dimer 0.72 from 0.93  #2 Hypokalemia -repleted k 3.7.  Magnesium 2.4.  #3 bradycardia patient reports she has never been told she has low heart rate.  She is not on any rate limiting agents and her electrolytes are normal.  Will obtain EKG and echocardiogram to further evaluate.  Estimated body mass index is 23.17 kg/m as calculated from the following:   Height as of this encounter: 5\' 4"  (1.626 m).   Weight as of this encounter: 61.2 kg.  DVT prophylaxis: lovenox Code  Status: full Family Communication:dw patient  Disposition Plan:  Status is: Inpatient  Dispo: The patient is from:home              Anticipated d/c is XB:MWUX              Anticipated d/c date is 2 to 3 days              Patient currently is not medically stable to d/c.    Consultants: none  Procedures: none Antimicrobials:none Subjective: She is resting in bed awake and alert 96% on 2 L She wants to complete the remdesivir infusion as an inpatient   Objective: Vitals:   05/21/20 1353 05/21/20 2122 05/22/20 0505 05/22/20 0725  BP: 100/68 104/70 127/83 114/88  Pulse: (!) 54 62 (!) 50 (!) 48  Resp: 20 20 16 17   Temp: 97.7 F (36.5 C) 97.6 F (36.4 C) 97.9 F (36.6 C) 97.6 F (36.4 C)  TempSrc: Oral Oral Oral Oral  SpO2: 96% 93% 94% 96%  Weight:      Height:        Intake/Output Summary (Last 24 hours) at 05/22/2020 1017 Last data filed at 05/21/2020 1800 Gross per 24 hour  Intake 640 ml  Output --  Net 640 ml   Filed Weights   05/20/20 1716  Weight: 61.2 kg    Examination:  General exam: Appears calm and comfortable  Respiratory system: Coarse to auscultation. Respiratory effort normal. Cardiovascular system: S1 & S2 heard, RRR. No JVD, murmurs, rubs, gallops or clicks. No pedal edema. Gastrointestinal system: Abdomen is  nondistended, soft and nontender. No organomegaly or masses felt. Normal bowel sounds heard. Central nervous system: Alert and oriented. No focal neurological deficits. Extremities: Symmetric 5 x 5 power. Skin: No rashes, lesions or ulcers Psychiatry: Judgement and insight appear normal. Mood & affect appropriate.     Data Reviewed: I have personally reviewed following labs and imaging studies  CBC: Recent Labs  Lab 05/20/20 1820 05/21/20 0118 05/22/20 0109  WBC 6.2 4.2 7.2  NEUTROABS  --  3.6 5.7  HGB 13.1 13.4 12.8  HCT 40.1 41.4 38.9  MCV 95.9 94.7 93.7  PLT 392 412* 829*   Basic Metabolic Panel: Recent Labs  Lab  05/20/20 1820 05/21/20 0118 05/22/20 0109  NA 142 140 141  K 3.5 3.4* 3.7  CL 99 97* 103  CO2 30 27 26   GLUCOSE 101* 190* 139*  BUN 9 9 12   CREATININE 0.88 0.67 0.67  CALCIUM 9.2 9.1 9.5  MG  --  2.5* 2.4  PHOS  --  2.4* 1.9*   GFR: Estimated Creatinine Clearance: 75.9 mL/min (by C-G formula based on SCr of 0.67 mg/dL). Liver Function Tests: Recent Labs  Lab 05/21/20 0118 05/22/20 0109  AST 36 41  ALT 24 29  ALKPHOS 72 61  BILITOT 0.6 0.8  PROT 7.6 6.8  ALBUMIN 3.0* 2.7*   No results for input(s): LIPASE, AMYLASE in the last 168 hours. No results for input(s): AMMONIA in the last 168 hours. Coagulation Profile: No results for input(s): INR, PROTIME in the last 168 hours. Cardiac Enzymes: No results for input(s): CKTOTAL, CKMB, CKMBINDEX, TROPONINI in the last 168 hours. BNP (last 3 results) No results for input(s): PROBNP in the last 8760 hours. HbA1C: No results for input(s): HGBA1C in the last 72 hours. CBG: No results for input(s): GLUCAP in the last 168 hours. Lipid Profile: Recent Labs    05/20/20 1938  TRIG 163*   Thyroid Function Tests: No results for input(s): TSH, T4TOTAL, FREET4, T3FREE, THYROIDAB in the last 72 hours. Anemia Panel: Recent Labs    05/21/20 0118 05/22/20 0109  FERRITIN 607* 515*   Sepsis Labs: Recent Labs  Lab 05/20/20 1938 05/20/20 2026 05/20/20 2138  PROCALCITON <0.10  --   --   LATICACIDVEN  --  1.2 1.0    Recent Results (from the past 240 hour(s))  Respiratory Panel by RT PCR (Flu A&B, Covid) - Nasopharyngeal Swab     Status: Abnormal   Collection Time: 05/20/20  5:22 PM   Specimen: Nasopharyngeal Swab; Nasopharyngeal(NP) swabs in vial transport medium  Result Value Ref Range Status   SARS Coronavirus 2 by RT PCR POSITIVE (A) NEGATIVE Final    Comment: RESULT CALLED TO, READ BACK BY AND VERIFIED WITH: G TATE RN 1933 05/20/20 A BROWNING (NOTE) SARS-CoV-2 target nucleic acids are DETECTED.  SARS-CoV-2 RNA is  generally detectable in upper respiratory specimens  during the acute phase of infection. Positive results are indicative of the presence of the identified virus, but do not rule out bacterial infection or co-infection with other pathogens not detected by the test. Clinical correlation with patient history and other diagnostic information is necessary to determine patient infection status. The expected result is Negative.  Fact Sheet for Patients:  PinkCheek.be  Fact Sheet for Healthcare Providers: GravelBags.it  This test is not yet approved or cleared by the [place] FDA and  has been authorized for detection and/or diagnosis of SARS-CoV-2 by FDA under an Emergency Use Authorization (EUA).  This EUA will remain  in effect (meaning this test can be  used) for the duration of  the COVID-19 declaration under Section 564(b)(1) of the Act, 21 U.S.C. section 360bbb-3(b)(1), unless the authorization is terminated or revoked sooner.      Influenza A by PCR NEGATIVE NEGATIVE Final   Influenza B by PCR NEGATIVE NEGATIVE Final    Comment: (NOTE) The Xpert Xpress SARS-CoV-2/FLU/RSV assay is intended as an aid in  the diagnosis of influenza from Nasopharyngeal swab specimens and  should not be used as a sole basis for treatment. Nasal washings and  aspirates are unacceptable for Xpert Xpress SARS-CoV-2/FLU/RSV  testing.  Fact Sheet for Patients: PinkCheek.be  Fact Sheet for Healthcare Providers: GravelBags.it  This test is not yet approved or cleared by the [place] FDA and  has been authorized for detection and/or diagnosis of SARS-CoV-2 by  FDA under an Emergency Use Authorization (EUA). This EUA will remain  in effect (meaning this test can be used) for the duration of the  Covid-19 declaration under Section 564(b)(1) of the Act, 21  U.S.C. section  360bbb-3(b)(1), unless the authorization is  terminated or revoked. Performed at [facility], [place] [address] [place], [place] [address]   Blood Culture (routine x 2)     Status: None (Preliminary result)   Collection Time: 05/20/20  8:05 PM   Specimen: BLOOD  Result Value Ref Range Status   Specimen Description BLOOD RIGHT ANTECUBITAL  Final   Special Requests   Final    BOTTLES DRAWN AEROBIC AND ANAEROBIC Blood Culture adequate volume   Culture   Final    NO GROWTH 2 DAYS Performed at [facility], [place] [address] [place], [place] [address]    Report Status PENDING  Incomplete  Blood Culture (routine x 2)     Status: None (Preliminary result)   Collection Time: 05/21/20  1:18 AM   Specimen: BLOOD  Result Value Ref Range Status   Specimen Description BLOOD LEFT HAND  Final   Special Requests   Final    BOTTLES DRAWN AEROBIC ONLY Blood Culture adequate volume   Culture   Final    NO GROWTH 1 DAY Performed at [facility], [place] [address], [place],  [address]    Report Status PENDING  Incomplete         Radiology Studies: DG Chest Portable 1 View  Result Date: 05/20/2020 CLINICAL DATA:  49 year old female with shortness of breath. EXAM: PORTABLE CHEST 1 VIEW COMPARISON:  None. FINDINGS: Faint bilateral pulmonary opacities most consistent with multifocal pneumonia, likely viral or atypical in etiology including COVID-19. Clinical correlation is recommended. There is no pleural effusion pneumothorax. The cardiac silhouette is within limits. No acute osseous pathology. IMPRESSION: Multifocal pneumonia. Electronically Signed   By: Anner Crete M.D.   On: 05/20/2020 17:52        Scheduled Meds: . dexamethasone  6 mg Oral Q24H  . enoxaparin (LOVENOX) injection  40 mg Subcutaneous Q24H  . guaiFENesin  10 mL Oral Once  . sodium chloride flush  3 mL Intravenous Q12H   Continuous Infusions: . remdesivir 100 mg in NS 100 mL 100 mg (05/22/20 0930)      LOS: 2 days     Georgette Shell, MD  05/22/2020, 10:17 AM

## 2020-05-22 NOTE — Progress Notes (Signed)
*  PRELIMINARY RESULTS* Echocardiogram 2D Echocardiogram has been performed.  Leavy Cella 05/22/2020, 3:59 PM

## 2020-05-22 NOTE — Plan of Care (Signed)
Patient is currently resting in bed. OOB to BR. VSS. Remains on 2L [place]. Call bell within reach.   Problem: Education: Goal: Knowledge of risk factors and measures for prevention of condition will improve Outcome: Progressing   Problem: Coping: Goal: Psychosocial and spiritual needs will be supported Outcome: Progressing   Problem: Respiratory: Goal: Will maintain a patent airway Outcome: Progressing Goal: Complications related to the disease process, condition or treatment will be avoided or minimized Outcome: Progressing   Problem: Education: Goal: Knowledge of General Education information will improve Description: Including pain rating scale, medication(s)/side effects and non-pharmacologic comfort measures Outcome: Progressing   Problem: Health Behavior/Discharge Planning: Goal: Ability to manage health-related needs will improve Outcome: Progressing   Problem: Clinical Measurements: Goal: Ability to maintain clinical measurements within normal limits will improve Outcome: Progressing Goal: Will remain free from infection Outcome: Progressing Goal: Diagnostic test results will improve Outcome: Progressing Goal: Respiratory complications will improve Outcome: Progressing Goal: Cardiovascular complication will be avoided Outcome: Progressing   Problem: Activity: Goal: Risk for activity intolerance will decrease Outcome: Progressing   Problem: Nutrition: Goal: Adequate nutrition will be maintained Outcome: Progressing   Problem: Coping: Goal: Level of anxiety will decrease Outcome: Progressing   Problem: Elimination: Goal: Will not experience complications related to bowel motility Outcome: Progressing Goal: Will not experience complications related to urinary retention Outcome: Progressing   Problem: Pain Managment: Goal: General experience of comfort will improve Outcome: Progressing   Problem: Safety: Goal: Ability to remain free from injury will  improve Outcome: Progressing   Problem: Skin Integrity: Goal: Risk for impaired skin integrity will decrease Outcome: Progressing

## 2020-05-23 LAB — COMPREHENSIVE METABOLIC PANEL
ALT: 29 U/L (ref 0–44)
AST: 35 U/L (ref 15–41)
Albumin: 2.8 g/dL — ABNORMAL LOW (ref 3.5–5.0)
Alkaline Phosphatase: 58 U/L (ref 38–126)
Anion gap: 15 (ref 5–15)
BUN: 12 mg/dL (ref 6–20)
CO2: 24 mmol/L (ref 22–32)
Calcium: 9.6 mg/dL (ref 8.9–10.3)
Chloride: 101 mmol/L (ref 98–111)
Creatinine, Ser: 0.77 mg/dL (ref 0.44–1.00)
GFR, Estimated: >60 mL/min (ref >60)
Glucose, Bld: 148 mg/dL — ABNORMAL HIGH (ref 70–99)
Potassium: 3.5 mmol/L (ref 3.5–5.1)
Sodium: 140 mmol/L (ref 135–145)
Total Bilirubin: 0.6 mg/dL (ref 0.3–1.2)
Total Protein: 6.6 g/dL (ref 6.5–8.1)

## 2020-05-23 LAB — CBC WITH DIFFERENTIAL/PLATELET
Abs Immature Granulocytes: 0.11 10*3/uL — ABNORMAL HIGH (ref 0.00–0.07)
Basophils Absolute: 0 10*3/uL (ref 0.0–0.1)
Basophils Relative: 0 %
Eosinophils Absolute: 0 10*3/uL (ref 0.0–0.5)
Eosinophils Relative: 0 %
HCT: 38.7 % (ref 36.0–46.0)
Hemoglobin: 12.5 g/dL (ref 12.0–15.0)
Immature Granulocytes: 1 %
Lymphocytes Relative: 9 %
Lymphs Abs: 0.9 10*3/uL (ref 0.7–4.0)
MCH: 30.3 pg (ref 26.0–34.0)
MCHC: 32.3 g/dL (ref 30.0–36.0)
MCV: 93.7 fL (ref 80.0–100.0)
Monocytes Absolute: 0.6 10*3/uL (ref 0.1–1.0)
Monocytes Relative: 7 %
Neutro Abs: 7.6 10*3/uL (ref 1.7–7.7)
Neutrophils Relative %: 83 %
Platelets: 537 10*3/uL — ABNORMAL HIGH (ref 150–400)
RBC: 4.13 MIL/uL (ref 3.87–5.11)
RDW: 12.3 % (ref 11.5–15.5)
WBC: 9.2 10*3/uL (ref 4.0–10.5)
nRBC: 0 % (ref 0.0–0.2)

## 2020-05-23 LAB — PHOSPHORUS: Phosphorus: 3 mg/dL (ref 2.5–4.6)

## 2020-05-23 LAB — C-REACTIVE PROTEIN: CRP: 0.7 mg/dL (ref <1.0)

## 2020-05-23 LAB — FERRITIN: Ferritin: 374 ng/mL — ABNORMAL HIGH (ref 11–307)

## 2020-05-23 LAB — D-DIMER, QUANTITATIVE: D-Dimer, Quant: 0.88 ug/mL-FEU — ABNORMAL HIGH (ref 0.00–0.50)

## 2020-05-23 LAB — MAGNESIUM: Magnesium: 2.2 mg/dL (ref 1.7–2.4)

## 2020-05-23 NOTE — Progress Notes (Signed)
PROGRESS NOTE    Tammy Colon  HUD:[id number] DOB: 17-Sep-1970 DOA: 05/20/2020 PCP: Patient, No Pcp Per    Brief Narrative:49 y.o. female with medical history significant of prior hysterectomy, GERD, migraines who presents with 8 days of viral illness with now worsening shortness of breath and worsening dyspnea on exertion. Patient states that she has been having 8 days of shortness of breath, cough, sore throat, chills, fatigue. Most of her symptoms had begun to improve, but her shortness of breath and dyspnea on exertion has been worsening. She has 2 family members who are Covid positive. She is not vaccinated against COVID-19. She denies chest pain, abdominal pain, constipation, diarrhea.  ED Course: Vital signs in the ED significant for desaturation to the mid 80s if she attempts to stand or do anything strenuous. Lab work showed normal BMP and CBC. Respiratory panel was positive for Covid. Inflammatory markers including CRP, fibrinogen, TG, ferritin, LDH, procalcitonin, D-dimer, lactic acid ordered in ED but not yet obtained. Blood cultures also ordered and are pending. Patient received 1 dose of Decadron in ED. Assessment & Plan:   Active Problems:   COVID-19   Hypoxia   #1 Acute hypoxic respiratory failure due to covid pneumonia-oxygen saturation improved compared to yesterday.  She is down to 2 L saturating 97%. Plan discharge home in a.m. as long as she remains stable after the last remdesivir infusion. cxr b/l multifocal infiltrates  Continue remdesvir  Continue decadron Encourage IS Flutter valve  oob Encourage proning CRP 2.5 from 9.2 from 10.2 D-dimer 0.72 from 0.93  #2 Hypokalemia -repleted k 3.7.  Magnesium 2.4.  #3 bradycardia patient reports she has never been told she has low heart rate.  She is not on any rate limiting agents and her electrolytes are normal.  Still appears to be in sinus bradycardia with echo showing normal ejection fraction.  Patient  asymptomatic. Estimated body mass index is 23.17 kg/m as calculated from the following:   Height as of this encounter: 5\' 4"  (1.626 m).   Weight as of this encounter: 61.2 kg.  DVT prophylaxis: lovenox Code Status: full Family Communication:dw patient  Disposition Plan:  Status is: Inpatient  Dispo: The patient is from:home              Anticipated d/c is [facility]O              Anticipated d/c date is 2 to 3 days              Patient currently is not medically stable to d/c.    Consultants: none  Procedures: none Antimicrobials:none Subjective: She is resting in bed awake and alert 96% on 2 L She wants to complete the remdesivir infusion as an inpatient   Objective: Vitals:   05/22/20 1147 05/22/20 2056 05/23/20 0530 05/23/20 0742  BP: 108/82 (!) 135/96 118/80 120/84  Pulse: (!) 50 (!) 49 (!) 44 (!) 43  Resp: (!) 25 20 17 18   Temp: 98.1 F (36.7 C) 97.9 F (36.6 C) 97.6 F (36.4 C) 98.6 F (37 C)  TempSrc: Oral Oral Oral Oral  SpO2:  98% 97%   Weight:      Height:       No intake or output data in the 24 hours ending 05/23/20 1406 Filed Weights   05/20/20 1716  Weight: 61.2 kg    Examination:  General exam: Appears calm and comfortable  Respiratory system: Coarse to auscultation. Respiratory effort normal. Cardiovascular system: S1 & S2 heard,  RRR. No JVD, murmurs, rubs, gallops or clicks. No pedal edema. Gastrointestinal system: Abdomen is nondistended, soft and nontender. No organomegaly or masses felt. Normal bowel sounds heard. Central nervous system: Alert and oriented. No focal neurological deficits. Extremities: Symmetric 5 x 5 power. Skin: No rashes, lesions or ulcers Psychiatry: Judgement and insight appear normal. Mood & affect appropriate.     Data Reviewed: I have personally reviewed following labs and imaging studies  CBC: Recent Labs  Lab 05/20/20 1820 05/21/20 0118 05/22/20 0109 05/23/20 0134  WBC 6.2 4.2 7.2 9.2  NEUTROABS  --  3.6  5.7 7.6  HGB 13.1 13.4 12.8 12.5  HCT 40.1 41.4 38.9 38.7  MCV 95.9 94.7 93.7 93.7  PLT 392 412* 505* 841*   Basic Metabolic Panel: Recent Labs  Lab 05/20/20 1820 05/21/20 0118 05/22/20 0109 05/23/20 0134  NA 142 140 141 140  K 3.5 3.4* 3.7 3.5  CL 99 97* 103 101  CO2 30 27 26 24   GLUCOSE 101* 190* 139* 148*  BUN 9 9 12 12   CREATININE 0.88 0.67 0.67 0.77  CALCIUM 9.2 9.1 9.5 9.6  MG  --  2.5* 2.4 2.2  PHOS  --  2.4* 1.9* 3.0   GFR: Estimated Creatinine Clearance: 75.9 mL/min (by C-G formula based on SCr of 0.77 mg/dL). Liver Function Tests: Recent Labs  Lab 05/21/20 0118 05/22/20 0109 05/23/20 0134  AST 36 41 35  ALT 24 29 29   ALKPHOS 72 61 58  BILITOT 0.6 0.8 0.6  PROT 7.6 6.8 6.6  ALBUMIN 3.0* 2.7* 2.8*   No results for input(s): LIPASE, AMYLASE in the last 168 hours. No results for input(s): AMMONIA in the last 168 hours. Coagulation Profile: No results for input(s): INR, PROTIME in the last 168 hours. Cardiac Enzymes: No results for input(s): CKTOTAL, CKMB, CKMBINDEX, TROPONINI in the last 168 hours. BNP (last 3 results) No results for input(s): PROBNP in the last 8760 hours. HbA1C: No results for input(s): HGBA1C in the last 72 hours. CBG: No results for input(s): GLUCAP in the last 168 hours. Lipid Profile: Recent Labs    05/20/20 1938  TRIG 163*   Thyroid Function Tests: No results for input(s): TSH, T4TOTAL, FREET4, T3FREE, THYROIDAB in the last 72 hours. Anemia Panel: Recent Labs    05/22/20 0109 05/23/20 0134  FERRITIN 515* 374*   Sepsis Labs: Recent Labs  Lab 05/20/20 1938 05/20/20 2026 05/20/20 2138  PROCALCITON <0.10  --   --   LATICACIDVEN  --  1.2 1.0    Recent Results (from the past 240 hour(s))  Respiratory Panel by RT PCR (Flu A&B, Covid) - Nasopharyngeal Swab     Status: Abnormal   Collection Time: 05/20/20  5:22 PM   Specimen: Nasopharyngeal Swab; Nasopharyngeal(NP) swabs in vial transport medium  Result Value Ref  Range Status   SARS Coronavirus 2 by RT PCR POSITIVE (A) NEGATIVE Final    Comment: RESULT CALLED TO, READ BACK BY AND VERIFIED WITH: G TATE RN 1933 05/20/20 A BROWNING (NOTE) SARS-CoV-2 target nucleic acids are DETECTED.  SARS-CoV-2 RNA is generally detectable in upper respiratory specimens  during the acute phase of infection. Positive results are indicative of the presence of the identified virus, but do not rule out bacterial infection or co-infection with other pathogens not detected by the test. Clinical correlation with patient history and other diagnostic information is necessary to determine patient infection status. The expected result is Negative.  Fact Sheet for Patients:  PinkCheek.be  Fact  Sheet for Healthcare Providers: GravelBags.it  This test is not yet approved or cleared by the [place] and  has been authorized for detection and/or diagnosis of SARS-CoV-2 by FDA under an Emergency Use Authorization (EUA).  This EUA will remain in effect (meaning this test can be  used) for the duration of  the COVID-19 declaration under Section 564(b)(1) of the Act, 21 U.S.C. section 360bbb-3(b)(1), unless the authorization is terminated or revoked sooner.      Influenza A by PCR NEGATIVE NEGATIVE Final   Influenza B by PCR NEGATIVE NEGATIVE Final    Comment: (NOTE) The Xpert Xpress SARS-CoV-2/FLU/RSV assay is intended as an aid in  the diagnosis of influenza from Nasopharyngeal swab specimens and  should not be used as a sole basis for treatment. Nasal washings and  aspirates are unacceptable for Xpert Xpress SARS-CoV-2/FLU/RSV  testing.  Fact Sheet for Patients: PinkCheek.be  Fact Sheet for Healthcare Providers: GravelBags.it  This test is not yet approved or cleared by the [place] FDA and  has been authorized for detection and/or  diagnosis of SARS-CoV-2 by  FDA under an Emergency Use Authorization (EUA). This EUA will remain  in effect (meaning this test can be used) for the duration of the  Covid-19 declaration under Section 564(b)(1) of the Act, 21  U.S.C. section 360bbb-3(b)(1), unless the authorization is  terminated or revoked. Performed at [facility], [place] [address] [place], [place] [address]   Blood Culture (routine x 2)     Status: None (Preliminary result)   Collection Time: 05/20/20  8:05 PM   Specimen: BLOOD  Result Value Ref Range Status   Specimen Description BLOOD RIGHT ANTECUBITAL  Final   Special Requests   Final    BOTTLES DRAWN AEROBIC AND ANAEROBIC Blood Culture adequate volume   Culture   Final    NO GROWTH 2 DAYS Performed at [facility], [place] [address], [place], [place] [address]    Report Status PENDING  Incomplete  Blood Culture (routine x 2)     Status: None (Preliminary result)   Collection Time: 05/21/20  1:18 AM   Specimen: BLOOD  Result Value Ref Range Status   Specimen Description BLOOD LEFT HAND  Final   Special Requests   Final    BOTTLES DRAWN AEROBIC ONLY Blood Culture adequate volume   Culture   Final    NO GROWTH 1 DAY Performed at [facility], [place] [address], [place],  [address]    Report Status PENDING  Incomplete         Radiology Studies: ECHOCARDIOGRAM COMPLETE  Result Date: 05/22/2020    ECHOCARDIOGRAM REPORT   Patient Name:   Mauri Brooklyn Date of Exam: 05/22/2020 Medical Rec #:  [id number]         Height:       64.0 in Accession #:    [id number]        Weight:       135.0 lb Date of Birth:  Oct 29, 1970        BSA:          1.655 m Patient Age:    57 years          BP:           108/82 mmHg Patient Gender: F                 HR:           50  bpm. Exam Location:  Inpatient Procedure: 2D Echo Indications:    Abnormal ECG 794.31 / R94.31  History:        Patient has no prior history of Echocardiogram examinations.                  Risk Factors:Former Smoker. COVID 19, Hypoxia.  Sonographer:    Leavy Cella Referring Phys: [id number] Paton  1. Left ventricular ejection fraction, by estimation, is 55 to 60%. The left ventricle has normal function. The left ventricle has no regional wall motion abnormalities. There is mild left ventricular hypertrophy. Left ventricular diastolic parameters were normal.  2. Right ventricular systolic function is normal. The right ventricular size is normal. There is normal pulmonary artery systolic pressure. The estimated right ventricular systolic pressure is 85.6 mmHg.  3. The mitral valve is normal in structure. Trivial mitral valve regurgitation. No evidence of mitral stenosis.  4. The aortic valve is tricuspid. Aortic valve regurgitation is not visualized. No aortic stenosis is present.  5. The inferior vena cava is normal in size with greater than 50% respiratory variability, suggesting right atrial pressure of 3 mmHg. FINDINGS  Left Ventricle: Left ventricular ejection fraction, by estimation, is 55 to 60%. The left ventricle has normal function. The left ventricle has no regional wall motion abnormalities. The left ventricular internal cavity size was normal in size. There is  mild left ventricular hypertrophy. Left ventricular diastolic parameters were normal. Right Ventricle: The right ventricular size is normal. Right vetricular wall thickness was not assessed. Right ventricular systolic function is normal. There is normal pulmonary artery systolic pressure. The tricuspid regurgitant velocity is 2.02 m/s, and with an assumed right atrial pressure of 3 mmHg, the estimated right ventricular systolic pressure is 31.4 mmHg. Left Atrium: Left atrial size was normal in size. Right Atrium: Right atrial size was normal in size. Pericardium: There is no evidence of pericardial effusion. Mitral Valve: The mitral valve is normal in structure. Trivial mitral valve regurgitation. No  evidence of mitral valve stenosis. Tricuspid Valve: The tricuspid valve is normal in structure. Tricuspid valve regurgitation is trivial. Aortic Valve: The aortic valve is tricuspid. Aortic valve regurgitation is not visualized. No aortic stenosis is present. Pulmonic Valve: The pulmonic valve was not well visualized. Pulmonic valve regurgitation is trivial. Aorta: The aortic root is normal in size and structure. Venous: The inferior vena cava is normal in size with greater than 50% respiratory variability, suggesting right atrial pressure of 3 mmHg. IAS/Shunts: The interatrial septum was not well visualized.  LEFT VENTRICLE PLAX 2D LVIDd:         4.00 cm  Diastology LVIDs:         2.90 cm  LV e' medial:    8.59 cm/s LV PW:         1.10 cm  LV E/e' medial:  6.4 LV IVS:        1.10 cm  LV e' lateral:   12.00 cm/s LVOT diam:     2.00 cm  LV E/e' lateral: 4.6 LVOT Area:     3.14 cm  RIGHT VENTRICLE RV S prime:     13.90 cm/s LEFT ATRIUM             Index       RIGHT ATRIUM           Index LA diam:        2.80 cm 1.69 cm/m  RA Area:     14.40  cm LA Vol (A2C):   40.5 ml 24.47 ml/m RA Volume:   34.50 ml  20.84 ml/m LA Vol (A4C):   30.5 ml 18.42 ml/m LA Biplane Vol: 35.3 ml 21.32 ml/m   AORTA Ao Root diam: 2.00 cm MITRAL VALVE               TRICUSPID VALVE MV Area (PHT): 2.62 cm    TR Peak grad:   16.3 mmHg MV Decel Time: 289 msec    TR Vmax:        202.00 cm/s MV E velocity: 54.80 cm/s MV A velocity: 28.70 cm/s  SHUNTS MV E/A ratio:  1.91        Systemic Diam: 2.00 cm Oswaldo Milian MD Electronically signed by Oswaldo Milian MD Signature Date/Time: 05/22/2020/8:21:45 PM    Final         Scheduled Meds: . dexamethasone  6 mg Oral Q24H  . enoxaparin (LOVENOX) injection  40 mg Subcutaneous Q24H  . sodium chloride flush  3 mL Intravenous Q12H   Continuous Infusions: . remdesivir 100 mg in NS 100 mL 100 mg (05/23/20 0911)     LOS: 3 days     Georgette Shell, MD  05/23/2020, 2:06 PM

## 2020-05-24 LAB — CBC WITH DIFFERENTIAL/PLATELET
Abs Immature Granulocytes: 0.18 10*3/uL — ABNORMAL HIGH (ref 0.00–0.07)
Basophils Absolute: 0 10*3/uL (ref 0.0–0.1)
Basophils Relative: 0 %
Eosinophils Absolute: 0 10*3/uL (ref 0.0–0.5)
Eosinophils Relative: 0 %
HCT: 37.2 % (ref 36.0–46.0)
Hemoglobin: 12 g/dL (ref 12.0–15.0)
Immature Granulocytes: 2 %
Lymphocytes Relative: 10 %
Lymphs Abs: 1 10*3/uL (ref 0.7–4.0)
MCH: 30.1 pg (ref 26.0–34.0)
MCHC: 32.3 g/dL (ref 30.0–36.0)
MCV: 93.2 fL (ref 80.0–100.0)
Monocytes Absolute: 0.7 10*3/uL (ref 0.1–1.0)
Monocytes Relative: 7 %
Neutro Abs: 7.7 10*3/uL (ref 1.7–7.7)
Neutrophils Relative %: 81 %
Platelets: 485 10*3/uL — ABNORMAL HIGH (ref 150–400)
RBC: 3.99 MIL/uL (ref 3.87–5.11)
RDW: 12.2 % (ref 11.5–15.5)
WBC: 9.5 10*3/uL (ref 4.0–10.5)
nRBC: 0 % (ref 0.0–0.2)

## 2020-05-24 LAB — COMPREHENSIVE METABOLIC PANEL
ALT: 30 U/L (ref 0–44)
AST: 48 U/L — ABNORMAL HIGH (ref 15–41)
Albumin: 2.6 g/dL — ABNORMAL LOW (ref 3.5–5.0)
Alkaline Phosphatase: 50 U/L (ref 38–126)
Anion gap: 12 (ref 5–15)
BUN: 13 mg/dL (ref 6–20)
CO2: 25 mmol/L (ref 22–32)
Calcium: 9.5 mg/dL (ref 8.9–10.3)
Chloride: 100 mmol/L (ref 98–111)
Creatinine, Ser: 0.77 mg/dL (ref 0.44–1.00)
GFR, Estimated: >60 mL/min (ref >60)
Glucose, Bld: 168 mg/dL — ABNORMAL HIGH (ref 70–99)
Potassium: 5.2 mmol/L — ABNORMAL HIGH (ref 3.5–5.1)
Sodium: 137 mmol/L (ref 135–145)
Total Bilirubin: 1.8 mg/dL — ABNORMAL HIGH (ref 0.3–1.2)
Total Protein: 5.9 g/dL — ABNORMAL LOW (ref 6.5–8.1)

## 2020-05-24 LAB — PHOSPHORUS: Phosphorus: 4.5 mg/dL (ref 2.5–4.6)

## 2020-05-24 LAB — FERRITIN: Ferritin: 270 ng/mL (ref 11–307)

## 2020-05-24 LAB — D-DIMER, QUANTITATIVE: D-Dimer, Quant: 0.81 ug/mL-FEU — ABNORMAL HIGH (ref 0.00–0.50)

## 2020-05-24 LAB — MAGNESIUM: Magnesium: 2.1 mg/dL (ref 1.7–2.4)

## 2020-05-24 LAB — C-REACTIVE PROTEIN: CRP: <0.5 mg/dL (ref <1.0)

## 2020-05-24 MED ORDER — ALBUTEROL SULFATE HFA 108 (90 BASE) MCG/ACT IN AERS: 2.0000 | INHALATION_SPRAY | Freq: Four times a day (QID) | RESPIRATORY_TRACT | 2 refills | Status: DC | PRN

## 2020-05-24 MED ORDER — DEXAMETHASONE 6 MG PO TABS
6.0000 mg | ORAL_TABLET | ORAL | 0 refills | Status: DC
Start: 2020-05-24 — End: 2021-04-02

## 2020-05-24 NOTE — Discharge Summary (Signed)
Physician Discharge Summary  Tammy Colon:[id number] DOB: 01-Sep-1970 DOA: 05/20/2020  PCP: Patient, No Pcp Per  Admit date: 05/20/2020 Discharge date: 05/24/2020  Admitted From: home Disposition:  home Recommendations for Outpatient Follow-up:  1. Follow up with PCP in 1-2 weeks 2. Please obtain BMP/CBC in one week  Home Health none Equipment/Devices: None  Discharge Condition: Stable CODE STATUS full code Diet recommendation: Cardiac Brief/Interim Summary:48 y.o.femalewith medical history significant ofprior hysterectomy, GERD, migraines who presents with 8 days of viral illness with now worsening shortness of breath and worsening dyspnea on exertion. Patient states that she has been having 8 days of shortness of breath, cough, sore throat, chills, fatigue. Most of her symptoms had begun to improve, but her shortness of breath and dyspnea on exertion has been worsening. She has 2 family members who are Covid positive. She is not vaccinated against COVID-19. She denies chest pain, abdominal pain, constipation, diarrhea.  ED Course:Vital signs in the ED significant for desaturation to the mid 80s if she attempts to stand or do anything strenuous. Lab work showed normal BMP and CBC. Respiratory panel was positive for Covid. Inflammatory markers including CRP, fibrinogen, TG, ferritin, LDH, procalcitonin, D-dimer, lactic acid ordered in ED but not yet obtained. Blood cultures also ordered and are pending. Patient received 1 dose of Decadron in ED.  Discharge Diagnoses:  Active Problems:   COVID-19   Hypoxia     #1 Acute hypoxic respiratory failure due to covid pneumonia-patient was admitted shortness of breath cough and hypoxia.  Chest x-ray showed bibasilar groundglass opacities.  She was treated with remdesivir and steroids with improvement in her symptoms. She was able to maintain her saturation on room air prior to discharge. She was asked to continue using incentive  spirometry at home and to sleep prone at home. She will be discharged on Decadron 4 mg daily for 7 more days.  #2 Hypokalemia -repleted k 3.7.  Magnesium 2.4.  #3 bradycardia -patient was in sinus bradycardia during the hospital stay.  She had no symptoms with this.  Echocardiogram showed left ventricular ejection fraction 55 to 60%.  Normal function.  No regional wall motion abnormalities.  Mild LVH.   Estimated body mass index is 23.17 kg/m as calculated from the following:   Height as of this encounter: 5\' 4"  (1.626 m).   Weight as of this encounter: 61.2 kg.  Discharge Instructions  Discharge Instructions    Diet - low sodium heart healthy   Complete by: As directed    Increase activity slowly   Complete by: As directed      Allergies as of 05/24/2020   No Known Allergies     Medication List    STOP taking these medications   aspirin-sod bicarb-citric acid 325 MG Tbef tablet Commonly known as: ALKA-SELTZER   ibuprofen 200 MG tablet Commonly known as: ADVIL   ibuprofen 800 MG tablet Commonly known as: ADVIL   NYQUIL MULTI-SYMPTOM PO   traMADol 50 MG tablet Commonly known as: ULTRAM     TAKE these medications   albuterol 108 (90 Base) MCG/ACT inhaler Commonly known as: VENTOLIN HFA Inhale 2 puffs into the lungs every 6 (six) hours as needed for wheezing or shortness of breath.   dexamethasone 6 MG tablet Commonly known as: DECADRON Take 1 tablet (6 mg total) by mouth daily.   THERAFLU FLU/COLD/COUGH PO Take 1 packet by mouth every 4 (four) hours as needed (for flu-like symptoms/mix contents of 1 packet into 8 ounces  of water).       Follow-up [facility].   Why: call on monday to make apptfor primary care  Contact information: [place] [address] [phone]             No Known Allergies  Consultations: None  Procedures/Studies: DG Chest  Portable 1 View  Result Date: 05/20/2020 CLINICAL DATA:  49 year old female with shortness of breath. EXAM: PORTABLE CHEST 1 VIEW COMPARISON:  None. FINDINGS: Faint bilateral pulmonary opacities most consistent with multifocal pneumonia, likely viral or atypical in etiology including COVID-19. Clinical correlation is recommended. There is no pleural effusion pneumothorax. The cardiac silhouette is within limits. No acute osseous pathology. IMPRESSION: Multifocal pneumonia. Electronically Signed   By: Anner Crete M.D.   On: 05/20/2020 17:52   ECHOCARDIOGRAM COMPLETE  Result Date: 05/22/2020    ECHOCARDIOGRAM REPORT   Patient Name:   Tammy Colon Date of Exam: 05/22/2020 Medical Rec #:  [id number]         Height:       64.0 in Accession #:    [id number]        Weight:       135.0 lb Date of Birth:  March 12, 1971        BSA:          1.655 m Patient Age:    83 years          BP:           108/82 mmHg Patient Gender: F                 HR:           50 bpm. Exam Location:  Inpatient Procedure: 2D Echo Indications:    Abnormal ECG 794.31 / R94.31  History:        Patient has no prior history of Echocardiogram examinations.                 Risk Factors:Former Smoker. COVID 19, Hypoxia.  Sonographer:    Leavy Cella Referring Phys: [id number] [place]  1. Left ventricular ejection fraction, by estimation, is 55 to 60%. The left ventricle has normal function. The left ventricle has no regional wall motion abnormalities. There is mild left ventricular hypertrophy. Left ventricular diastolic parameters were normal.  2. Right ventricular systolic function is normal. The right ventricular size is normal. There is normal pulmonary artery systolic pressure. The estimated right ventricular systolic pressure is 24.4 mmHg.  3. The mitral valve is normal in structure. Trivial mitral valve regurgitation. No evidence of mitral stenosis.  4. The aortic valve is tricuspid. Aortic valve regurgitation  is not visualized. No aortic stenosis is present.  5. The inferior vena cava is normal in size with greater than 50% respiratory variability, suggesting right atrial pressure of 3 mmHg. FINDINGS  Left Ventricle: Left ventricular ejection fraction, by estimation, is 55 to 60%. The left ventricle has normal function. The left ventricle has no regional wall motion abnormalities. The left ventricular internal cavity size was normal in size. There is  mild left ventricular hypertrophy. Left ventricular diastolic parameters were normal. Right Ventricle: The right ventricular size is normal. Right vetricular wall thickness was not assessed. Right ventricular systolic function is normal. There is normal pulmonary artery systolic pressure. The tricuspid regurgitant velocity is 2.02 m/s, and with an assumed right atrial pressure of 3 mmHg, the estimated right ventricular  systolic pressure is 00.7 mmHg. Left Atrium: Left atrial size was normal in size. Right Atrium: Right atrial size was normal in size. Pericardium: There is no evidence of pericardial effusion. Mitral Valve: The mitral valve is normal in structure. Trivial mitral valve regurgitation. No evidence of mitral valve stenosis. Tricuspid Valve: The tricuspid valve is normal in structure. Tricuspid valve regurgitation is trivial. Aortic Valve: The aortic valve is tricuspid. Aortic valve regurgitation is not visualized. No aortic stenosis is present. Pulmonic Valve: The pulmonic valve was not well visualized. Pulmonic valve regurgitation is trivial. Aorta: The aortic root is normal in size and structure. Venous: The inferior vena cava is normal in size with greater than 50% respiratory variability, suggesting right atrial pressure of 3 mmHg. IAS/Shunts: The interatrial septum was not well visualized.  LEFT VENTRICLE PLAX 2D LVIDd:         4.00 cm  Diastology LVIDs:         2.90 cm  LV e' medial:    8.59 cm/s LV PW:         1.10 cm  LV E/e' medial:  6.4 LV IVS:         1.10 cm  LV e' lateral:   12.00 cm/s LVOT diam:     2.00 cm  LV E/e' lateral: 4.6 LVOT Area:     3.14 cm  RIGHT VENTRICLE RV S prime:     13.90 cm/s LEFT ATRIUM             Index       RIGHT ATRIUM           Index LA diam:        2.80 cm 1.69 cm/m  RA Area:     14.40 cm LA Vol (A2C):   40.5 ml 24.47 ml/m RA Volume:   34.50 ml  20.84 ml/m LA Vol (A4C):   30.5 ml 18.42 ml/m LA Biplane Vol: 35.3 ml 21.32 ml/m   AORTA Ao Root diam: 2.00 cm MITRAL VALVE               TRICUSPID VALVE MV Area (PHT): 2.62 cm    TR Peak grad:   16.3 mmHg MV Decel Time: 289 msec    TR Vmax:        202.00 cm/s MV E velocity: 54.80 cm/s MV A velocity: 28.70 cm/s  SHUNTS MV E/A ratio:  1.91        Systemic Diam: 2.00 cm Oswaldo Milian MD Electronically signed by Oswaldo Milian MD Signature Date/Time: 05/22/2020/8:21:45 PM    Final    (Echo, Carotid, EGD, Colonoscopy, ERCP)    Subjective: Patient resting in bed anxious to go home maintaining saturation on room air.  Remains bradycardic no symptoms.  Discharge Exam: Vitals:   05/24/20 0831 05/24/20 1041  BP: 124/74 (!) 128/91  Pulse: (!) 44 (!) 50  Resp: 17 (!) 22  Temp: (!) 97.4 F (36.3 C) (!) 97.5 F (36.4 C)  SpO2: 98%    Vitals:   05/24/20 0320 05/24/20 0743 05/24/20 0831 05/24/20 1041  BP: 119/78 (!) 128/91 124/74 (!) 128/91  Pulse: (!) 45 (!) 43 (!) 44 (!) 50  Resp: 19 (!) 21 17 (!) 22  Temp: 97.8 F (36.6 C) (!) 97.5 F (36.4 C) (!) 97.4 F (36.3 C) (!) 97.5 F (36.4 C)  TempSrc: Oral Oral Oral Oral  SpO2: 99% 97% 98%   Weight:      Height:        General:  Pt is alert, awake, not in acute distress Cardiovascular: RRR, S1/S2 +, no rubs, no gallops Respiratory: CTA bilaterally, no wheezing, no rhonchi Abdominal: Soft, NT, ND, bowel sounds + Extremities: no edema, no cyanosis    The results of significant diagnostics from this hospitalization (including imaging, microbiology, ancillary and laboratory) are listed below for  reference.     Microbiology: Recent Results (from the past 240 hour(s))  Respiratory Panel by RT PCR (Flu A&B, Covid) - Nasopharyngeal Swab     Status: Abnormal   Collection Time: 05/20/20  5:22 PM   Specimen: Nasopharyngeal Swab; Nasopharyngeal(NP) swabs in vial transport medium  Result Value Ref Range Status   SARS Coronavirus 2 by RT PCR POSITIVE (A) NEGATIVE Final    Comment: RESULT CALLED TO, READ BACK BY AND VERIFIED WITH: G TATE RN 1933 05/20/20 A BROWNING (NOTE) SARS-CoV-2 target nucleic acids are DETECTED.  SARS-CoV-2 RNA is generally detectable in upper respiratory specimens  during the acute phase of infection. Positive results are indicative of the presence of the identified virus, but do not rule out bacterial infection or co-infection with other pathogens not detected by the test. Clinical correlation with patient history and other diagnostic information is necessary to determine patient infection status. The expected result is Negative.  Fact Sheet for Patients:  PinkCheek.be  Fact Sheet for Healthcare Providers: GravelBags.it  This test is not yet approved or cleared by the [place] FDA and  has been authorized for detection and/or diagnosis of SARS-CoV-2 by FDA under an Emergency Use Authorization (EUA).  This EUA will remain in effect (meaning this test can be  used) for the duration of  the COVID-19 declaration under Section 564(b)(1) of the Act, 21 U.S.C. section 360bbb-3(b)(1), unless the authorization is terminated or revoked sooner.      Influenza A by PCR NEGATIVE NEGATIVE Final   Influenza B by PCR NEGATIVE NEGATIVE Final    Comment: (NOTE) The Xpert Xpress SARS-CoV-2/FLU/RSV assay is intended as an aid in  the diagnosis of influenza from Nasopharyngeal swab specimens and  should not be used as a sole basis for treatment. Nasal washings and  aspirates are unacceptable for Xpert Xpress  SARS-CoV-2/FLU/RSV  testing.  Fact Sheet for Patients: PinkCheek.be  Fact Sheet for Healthcare Providers: GravelBags.it  This test is not yet approved or cleared by the [place] FDA and  has been authorized for detection and/or diagnosis of SARS-CoV-2 by  FDA under an Emergency Use Authorization (EUA). This EUA will remain  in effect (meaning this test can be used) for the duration of the  Covid-19 declaration under Section 564(b)(1) of the Act, 21  U.S.C. section 360bbb-3(b)(1), unless the authorization is  terminated or revoked. Performed at [facility], [place] [address], [place], [place] [address]   Blood Culture (routine x 2)     Status: None (Preliminary result)   Collection Time: 05/20/20  8:05 PM   Specimen: BLOOD  Result Value Ref Range Status   Specimen Description BLOOD RIGHT ANTECUBITAL  Final   Special Requests   Final    BOTTLES DRAWN AEROBIC AND ANAEROBIC Blood Culture adequate volume   Culture   Final    NO GROWTH 4 DAYS Performed at [facility], [place] [address] [place], [place] [address]    Report Status PENDING  Incomplete  Blood Culture (routine x 2)     Status: None (Preliminary result)   Collection Time: 05/21/20  1:18 AM   Specimen: BLOOD  Result  Value Ref Range Status   Specimen Description BLOOD LEFT HAND  Final   Special Requests   Final    BOTTLES DRAWN AEROBIC ONLY Blood Culture adequate volume   Culture   Final    NO GROWTH 3 DAYS Performed at [facility], [address], [place], [place] [address]    Report Status PENDING  Incomplete     Labs: BNP (last 3 results) No results for input(s): BNP in the last 8760 hours. Basic Metabolic Panel: Recent Labs  Lab 05/20/20 1820 05/21/20 0118 05/22/20 0109 05/23/20 0134 05/24/20 0257  NA 142 140 141 140 137  K 3.5 3.4* 3.7 3.5 5.2*  CL 99 97* 103 101 100  CO2 30 27 26 24 25   GLUCOSE 101* 190* 139* 148* 168*   BUN 9 9 12 12 13   CREATININE 0.88 0.67 0.67 0.77 0.77  CALCIUM 9.2 9.1 9.5 9.6 9.5  MG  --  2.5* 2.4 2.2 2.1  PHOS  --  2.4* 1.9* 3.0 4.5   Liver Function Tests: Recent Labs  Lab 05/21/20 0118 05/22/20 0109 05/23/20 0134 05/24/20 0257  AST 36 41 35 48*  ALT 24 29 29 30   ALKPHOS 72 61 58 50  BILITOT 0.6 0.8 0.6 1.8*  PROT 7.6 6.8 6.6 5.9*  ALBUMIN 3.0* 2.7* 2.8* 2.6*   No results for input(s): LIPASE, AMYLASE in the last 168 hours. No results for input(s): AMMONIA in the last 168 hours. CBC: Recent Labs  Lab 05/20/20 1820 05/21/20 0118 05/22/20 0109 05/23/20 0134 05/24/20 0257  WBC 6.2 4.2 7.2 9.2 9.5  NEUTROABS  --  3.6 5.7 7.6 7.7  HGB 13.1 13.4 12.8 12.5 12.0  HCT 40.1 41.4 38.9 38.7 37.2  MCV 95.9 94.7 93.7 93.7 93.2  PLT 392 412* 505* 537* 485*   Cardiac Enzymes: No results for input(s): CKTOTAL, CKMB, CKMBINDEX, TROPONINI in the last 168 hours. BNP: Invalid input(s): POCBNP CBG: No results for input(s): GLUCAP in the last 168 hours. D-Dimer Recent Labs    05/23/20 0134 05/24/20 0257  DDIMER 0.88* 0.81*   Hgb A1c No results for input(s): HGBA1C in the last 72 hours. Lipid Profile No results for input(s): CHOL, HDL, LDLCALC, TRIG, CHOLHDL, LDLDIRECT in the last 72 hours. Thyroid function studies No results for input(s): TSH, T4TOTAL, T3FREE, THYROIDAB in the last 72 hours.  Invalid input(s): FREET3 Anemia work up Recent Labs    05/23/20 0134 05/24/20 0257  FERRITIN 374* 270   Urinalysis No results found for: COLORURINE, APPEARANCEUR, LABSPEC, [place], GLUCOSEU, HGBUR, BILIRUBINUR, KETONESUR, PROTEINUR, UROBILINOGEN, NITRITE, LEUKOCYTESUR Sepsis Labs Invalid input(s): PROCALCITONIN,  WBC,  LACTICIDVEN Microbiology Recent Results (from the past 240 hour(s))  Respiratory Panel by RT PCR (Flu A&B, Covid) - Nasopharyngeal Swab     Status: Abnormal   Collection Time: 05/20/20  5:22 PM   Specimen: Nasopharyngeal Swab; Nasopharyngeal(NP) swabs in  vial transport medium  Result Value Ref Range Status   SARS Coronavirus 2 by RT PCR POSITIVE (A) NEGATIVE Final    Comment: RESULT CALLED TO, READ BACK BY AND VERIFIED WITH: G TATE RN 1933 05/20/20 A BROWNING (NOTE) SARS-CoV-2 target nucleic acids are DETECTED.  SARS-CoV-2 RNA is generally detectable in upper respiratory specimens  during the acute phase of infection. Positive results are indicative of the presence of the identified virus, but do not rule out bacterial infection or co-infection with other pathogens not detected by the test. Clinical correlation with patient history and other diagnostic information is necessary to  determine patient infection status. The expected result is Negative.  Fact Sheet for Patients:  PinkCheek.be  Fact Sheet for Healthcare Providers: GravelBags.it  This test is not yet approved or cleared by the [place] FDA and  has been authorized for detection and/or diagnosis of SARS-CoV-2 by FDA under an Emergency Use Authorization (EUA).  This EUA will remain in effect (meaning this test can be  used) for the duration of  the COVID-19 declaration under Section 564(b)(1) of the Act, 21 U.S.C. section 360bbb-3(b)(1), unless the authorization is terminated or revoked sooner.      Influenza A by PCR NEGATIVE NEGATIVE Final   Influenza B by PCR NEGATIVE NEGATIVE Final    Comment: (NOTE) The Xpert Xpress SARS-CoV-2/FLU/RSV assay is intended as an aid in  the diagnosis of influenza from Nasopharyngeal swab specimens and  should not be used as a sole basis for treatment. Nasal washings and  aspirates are unacceptable for Xpert Xpress SARS-CoV-2/FLU/RSV  testing.  Fact Sheet for Patients: PinkCheek.be  Fact Sheet for Healthcare Providers: GravelBags.it  This test is not yet approved or cleared by the [place] FDA and  has been  authorized for detection and/or diagnosis of SARS-CoV-2 by  FDA under an Emergency Use Authorization (EUA). This EUA will remain  in effect (meaning this test can be used) for the duration of the  Covid-19 declaration under Section 564(b)(1) of the Act, 21  U.S.C. section 360bbb-3(b)(1), unless the authorization is  terminated or revoked. Performed at [facility], [place] [address], [place], [place] [address]   Blood Culture (routine x 2)     Status: None (Preliminary result)   Collection Time: 05/20/20  8:05 PM   Specimen: BLOOD  Result Value Ref Range Status   Specimen Description BLOOD RIGHT ANTECUBITAL  Final   Special Requests   Final    BOTTLES DRAWN AEROBIC AND ANAEROBIC Blood Culture adequate volume   Culture   Final    NO GROWTH 4 DAYS Performed at [facility], [place] [address] [place], [place] [address]    Report Status PENDING  Incomplete  Blood Culture (routine x 2)     Status: None (Preliminary result)   Collection Time: 05/21/20  1:18 AM   Specimen: BLOOD  Result Value Ref Range Status   Specimen Description BLOOD LEFT HAND  Final   Special Requests   Final    BOTTLES DRAWN AEROBIC ONLY Blood Culture adequate volume   Culture   Final    NO GROWTH 3 DAYS Performed at [facility], [place] [address], [place], [place] [address]    Report Status PENDING  Incomplete     Time coordinating discharge: 39 minutes  SIGNED:   Georgette Shell, MD  [facility] 05/24/2020, 10:55 AM

## 2020-05-25 LAB — CULTURE, BLOOD (ROUTINE X 2)
Culture: NO GROWTH
Special Requests: ADEQUATE

## 2020-05-26 ENCOUNTER — Other Ambulatory Visit: Payer: No Typology Code available for payment source

## 2020-05-26 LAB — CULTURE, BLOOD (ROUTINE X 2)
Culture: NO GROWTH
Special Requests: ADEQUATE

## 2021-04-02 ENCOUNTER — Emergency Department (HOSPITAL_BASED_OUTPATIENT_CLINIC_OR_DEPARTMENT_OTHER)
Admission: EM | Admit: 2021-04-02 | Discharge: 2021-04-02 | Disposition: A | Discharge status: Home / Self Care | Payer: Self-pay | Attending: Emergency Medicine | Admitting: Emergency Medicine

## 2021-04-02 ENCOUNTER — Other Ambulatory Visit: Payer: Self-pay

## 2021-04-02 ENCOUNTER — Encounter (HOSPITAL_BASED_OUTPATIENT_CLINIC_OR_DEPARTMENT_OTHER): Payer: Self-pay | Admitting: Emergency Medicine

## 2021-04-02 DIAGNOSIS — M545 Low back pain, unspecified: Secondary | ICD-10-CM | POA: Insufficient documentation

## 2021-04-02 DIAGNOSIS — Z87891 Personal history of nicotine dependence: Secondary | ICD-10-CM | POA: Insufficient documentation

## 2021-04-02 MED ORDER — METHOCARBAMOL 500 MG PO TABS: 500.0000 mg | ORAL_TABLET | Freq: Two times a day (BID) | ORAL | 0 refills | Status: DC

## 2021-04-02 MED ORDER — NAPROXEN 500 MG PO TABS: 500.0000 mg | ORAL_TABLET | Freq: Two times a day (BID) | ORAL | 0 refills | Status: DC

## 2021-04-02 NOTE — ED Notes (Signed)
Pt dc home , stated understanding of dc instructions and prescriptions.
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
## 2021-04-14 ENCOUNTER — Other Ambulatory Visit: Payer: Self-pay

## 2021-04-14 ENCOUNTER — Emergency Department (HOSPITAL_BASED_OUTPATIENT_CLINIC_OR_DEPARTMENT_OTHER)
Admission: EM | Admit: 2021-04-14 | Discharge: 2021-04-14 | Disposition: A | Discharge status: Home / Self Care | Payer: Self-pay | Attending: Emergency Medicine | Admitting: Emergency Medicine

## 2021-04-14 ENCOUNTER — Encounter (HOSPITAL_BASED_OUTPATIENT_CLINIC_OR_DEPARTMENT_OTHER): Payer: Self-pay | Admitting: *Deleted

## 2021-04-14 DIAGNOSIS — K219 Gastro-esophageal reflux disease without esophagitis: Secondary | ICD-10-CM | POA: Insufficient documentation

## 2021-04-14 DIAGNOSIS — Z8616 Personal history of COVID-19: Secondary | ICD-10-CM | POA: Insufficient documentation

## 2021-04-14 DIAGNOSIS — N3000 Acute cystitis without hematuria: Secondary | ICD-10-CM | POA: Insufficient documentation

## 2021-04-14 DIAGNOSIS — M545 Low back pain, unspecified: Secondary | ICD-10-CM | POA: Insufficient documentation

## 2021-04-14 DIAGNOSIS — Z87891 Personal history of nicotine dependence: Secondary | ICD-10-CM | POA: Insufficient documentation

## 2021-04-14 LAB — URINALYSIS, ROUTINE W REFLEX MICROSCOPIC
Bilirubin Urine: NEGATIVE
Glucose, UA: NEGATIVE mg/dL
Ketones, ur: NEGATIVE mg/dL
Nitrite: POSITIVE — AB
Protein, ur: 30 mg/dL — AB
RBC / HPF: >50 RBC/hpf — ABNORMAL HIGH (ref 0–5)
Specific Gravity, Urine: 1.019 (ref 1.005–1.030)
WBC, UA: >50 WBC/hpf — ABNORMAL HIGH (ref 0–5)
pH: 6 (ref 5.0–8.0)

## 2021-04-14 MED ORDER — CEPHALEXIN 250 MG PO CAPS
500.0000 mg | ORAL_CAPSULE | Freq: Once | ORAL | Status: AC
  Administered 2021-04-14: 500 mg via ORAL
  Filled 2021-04-14: qty 2

## 2021-04-14 MED ORDER — METHYLPREDNISOLONE 4 MG PO TBPK: ORAL_TABLET | ORAL | 0 refills | Status: DC

## 2021-04-14 MED ORDER — CEPHALEXIN 500 MG PO CAPS: 1000.0000 mg | ORAL_CAPSULE | Freq: Two times a day (BID) | ORAL | 0 refills | Status: AC

## 2021-04-14 NOTE — ED Notes (Signed)
Culture sent with UA 

## 2021-04-14 NOTE — Discharge Instructions (Addendum)
Overall suspect symptoms may be secondary to urine tract infection.  Take antibiotic as prescribed.  Do not get your steroid pack filled.  Overall suspect symptoms will improve following antibiotic treatment.

## 2021-04-14 NOTE — ED Triage Notes (Signed)
Abd pain with back pain since Oct 6th, some nausea and chills. Back spasms that move to abd. Urine is concentrated.

## 2021-04-14 NOTE — ED Provider Notes (Signed)
[facility] Provider Note   CSN: [id number] Arrival date & time: 04/14/21  1660     History Chief Complaint  Patient presents with   Abdominal Pain   Back Pain    Tammy Colon is a 50 y.o. female.  The history is provided by the patient.  Back Pain Location:  Lumbar spine Quality:  Aching Radiates to:  Does not radiate Pain severity:  Mild Onset quality:  Gradual Duration:  2 weeks Timing:  Intermittent Progression:  Waxing and waning Chronicity:  New Relieved by:  NSAIDs Worsened by:  Ambulation Associated symptoms: abdominal pain   Associated symptoms: no abdominal swelling, no bladder incontinence, no bowel incontinence, no chest pain, no dysuria, no fever, no headaches, no leg pain, no numbness, no paresthesias, no pelvic pain, no perianal numbness, no tingling, no weakness and no weight loss   Associated symptoms comment:  Wraps from lower back to lower abdomen at times the pain      Past Medical History:  Diagnosis Date   Abnormal uterine bleeding    Anemia    GERD (gastroesophageal reflux disease)    no meds   Hormone disorder    Migraine with aura    blurred vision    Patient Active Problem List   Diagnosis Date Noted   Hypoxia    COVID-19 05/20/2020   Status post laparoscopic hysterectomy 09/20/2016   Irregular menses 04/06/2011    Past Surgical History:  Procedure Laterality Date   ABDOMINAL HYSTERECTOMY     CYSTOSCOPY N/A 09/20/2016   Procedure: CYSTOSCOPY possible;  Surgeon: Salvadore Dom, MD;  Location: [facility];  Service: Gynecology;  Laterality: N/A;  possible cysto   hysteroscopic myomectomy     LAPAROSCOPIC BILATERAL SALPINGECTOMY Bilateral 09/20/2016   Procedure: LAPAROSCOPIC BILATERAL SALPINGECTOMY;  Surgeon: Salvadore Dom, MD;  Location: [facility];  Service: Gynecology;  Laterality: Bilateral;   LAPAROSCOPIC HYSTERECTOMY N/A 09/20/2016   Procedure: HYSTERECTOMY TOTAL LAPAROSCOPIC;  Surgeon:  Salvadore Dom, MD;  Location: [facility];  Service: Gynecology;  Laterality: N/A;   TUBAL LIGATION       OB History     Gravida  4   Para  3   Term  2   Preterm  1   AB  1   Living  3      SAB      IAB      Ectopic      Multiple      Live Births  3           Family History  Problem Relation Age of Onset   Lupus Maternal Aunt    Diabetes Daughter     Social History   Tobacco Use   Smoking status: Former    Types: Cigars, E-cigarettes    Quit date: 09/26/2017    Years since quitting: 3.5   Smokeless tobacco: Never   Tobacco comments:    vapor   Vaping Use   Vaping Use: Some days   Substances: Nicotine, Flavoring   Devices: blu  Substance Use Topics   Alcohol use: Yes    Alcohol/week: 5.0 standard drinks    Types: 5 Glasses of wine per week   Drug use: No    Home Medications Prior to Admission medications   Medication Sig Start Date End Date Taking? Authorizing Provider  cephALEXin (KEFLEX) 500 MG capsule Take 2 capsules (1,000 mg total) by mouth 2 (two) times daily for 5 days. 04/14/21 04/19/21 Yes  Lavern Crimi, DO  albuterol (VENTOLIN HFA) 108 (90 Base) MCG/ACT inhaler Inhale 2 puffs into the lungs every 6 (six) hours as needed for wheezing or shortness of breath. 05/24/20   Georgette Shell, MD  methocarbamol (ROBAXIN) 500 MG tablet Take 1 tablet (500 mg total) by mouth 2 (two) times daily. 04/02/21   Truddie Hidden, MD  naproxen (NAPROSYN) 500 MG tablet Take 1 tablet (500 mg total) by mouth 2 (two) times daily. 04/02/21   Truddie Hidden, MD    Allergies    Patient has no known allergies.  Review of Systems   Review of Systems  Constitutional:  Negative for fever and weight loss.  HENT:  Negative for ear pain.   Cardiovascular:  Negative for chest pain.  Gastrointestinal:  Positive for abdominal pain. Negative for bowel incontinence.  Genitourinary:  Negative for bladder incontinence, dysuria and pelvic pain.   Musculoskeletal:  Positive for back pain.  Neurological:  Negative for tingling, weakness, numbness, headaches and paresthesias.  All other systems reviewed and are negative.  Physical Exam Updated Vital Signs BP 100/73 (BP Location: Right Arm)   Pulse 88   Temp 98.6 F (37 C) (Oral)   Resp 16   Ht 5\' 4"  (1.626 m)   Wt 63.5 kg   LMP 09/11/2016 (Exact Date)   SpO2 100%   BMI 24.03 kg/m   Physical Exam Vitals and nursing note reviewed.  Constitutional:      General: She is not in acute distress.    Appearance: She is well-developed. She is not ill-appearing.  HENT:     Head: Normocephalic and atraumatic.     Mouth/Throat:     Mouth: Mucous membranes are moist.     Pharynx: Oropharynx is clear.  Eyes:     Extraocular Movements: Extraocular movements intact.     Conjunctiva/sclera: Conjunctivae normal.     Pupils: Pupils are equal, round, and reactive to light.  Cardiovascular:     Rate and Rhythm: Regular rhythm.     Heart sounds: Normal heart sounds. No murmur heard. Pulmonary:     Effort: Pulmonary effort is normal. No respiratory distress.     Breath sounds: Normal breath sounds.  Abdominal:     General: Abdomen is flat.     Palpations: Abdomen is soft.     Tenderness: There is abdominal tenderness in the suprapubic area.  Musculoskeletal:     Cervical back: Neck supple.     Comments: No midline spinal tenderness, tenderness to bilateral lower/mid back  Skin:    General: Skin is warm and dry.     Capillary Refill: Capillary refill takes less than 2 seconds.  Neurological:     General: No focal deficit present.     Mental Status: She is alert and oriented to person, place, and time.     Cranial Nerves: No cranial nerve deficit.     Motor: No weakness.     Comments: 5+ out of 5 strength throughout, normal sensation  Psychiatric:        Mood and Affect: Mood normal.    ED Results / Procedures / Treatments   Labs (all labs ordered are listed, but only  abnormal results are displayed) Labs Reviewed  URINALYSIS, ROUTINE W REFLEX MICROSCOPIC - Abnormal; Notable for the following components:      Result Value   APPearance HAZY (*)    Hgb urine dipstick MODERATE (*)    Protein, ur 30 (*)    Nitrite POSITIVE (*)  Leukocytes,Ua LARGE (*)    RBC / HPF >50 (*)    WBC, UA >50 (*)    Bacteria, UA MANY (*)    All other components within normal limits  URINE CULTURE    EKG None  Radiology No results found.  Procedures Procedures   Medications Ordered in ED Medications  cephALEXin (KEFLEX) capsule 500 mg (has no administration in time range)    ED Course  I have reviewed the triage vital signs and the nursing notes.  Pertinent labs & imaging results that were available during my care of the patient were reviewed by me and considered in my medical decision making (see chart for details).    MDM Rules/Calculators/A&P                           Mauri Brooklyn is here with low back pain.  No significant medical history.  Normal vitals.  No fever.  Pain in the low back on and off for the last several days.  Pain patches have helped including naproxen and muscle relaxant.  No cauda equina symptoms.  Pain at times will radiate around to the lower abdomen.  She is not having any focal abdominal tenderness on exam.  No concern for cholecystitis or pancreatitis.  We will check a urine to evaluate for UTI.  Denies any dysuria but does have some suprapubic pain.  Does not have a history of physical consistent with kidney stone.  Overall suspect ongoing muscular issue.  She is neurovascular neuromuscularly intact.    Urinalysis appears consistent with infectious process.  Overall suspect UTI causing her symptoms.  She has bilateral lower back pain and appears comfortable.  Have very low suspicion for kidney stone.  We will treat with antibiotics.  Told to return if develop worsening pain, fever.  This chart was dictated using voice recognition  software.  Despite best efforts to proofread,  errors can occur which can change the documentation meaning.   Final Clinical Impression(s) / ED Diagnoses Final diagnoses:  Acute low back pain, unspecified back pain laterality, unspecified whether sciatica present  Acute cystitis without hematuria    Rx / DC Orders ED Discharge Orders          Ordered    methylPREDNISolone (MEDROL DOSEPAK) 4 MG TBPK tablet  Status:  Discontinued        04/14/21 0835    cephALEXin (KEFLEX) 500 MG capsule  2 times daily        04/14/21 [place], [place], DO 04/14/21 0848

## 2021-04-17 LAB — URINE CULTURE: Culture: >=100000 — AB

## 2021-04-19 ENCOUNTER — Telehealth (HOSPITAL_BASED_OUTPATIENT_CLINIC_OR_DEPARTMENT_OTHER): Payer: Self-pay | Admitting: Emergency Medicine

## 2021-04-19 NOTE — Telephone Encounter (Signed)
Post ED Visit - Positive Culture Follow-up  Culture report reviewed by antimicrobial stewardship pharmacist: [facility] Team []  Elenor Quinones, Pharm.D. []  Heide Guile, Pharm.D., BCPS AQ-ID []  Parks Neptune, Pharm.D., BCPS []  Alycia Rossetti, Pharm.D., BCPS []  [place], [place].D., BCPS, AAHIVP []  Legrand Como, Pharm.D., BCPS, AAHIVP []  Salome Arnt, PharmD, BCPS []  Johnnette Gourd, PharmD, BCPS []  Hughes Better, PharmD, BCPS [x]  Lorelei Pont, PharmD []  Laqueta Linden, PharmD, BCPS []  Albertina Parr, PharmD  [facility] Team []  Leodis Sias, PharmD []  Lindell Spar, PharmD []  Royetta Asal, PharmD []  Graylin Shiver, Rph []  Rema Fendt) Glennon Mac, PharmD []  Arlyn Dunning, PharmD []  Netta Cedars, PharmD []  Dia Sitter, PharmD []  Leone Haven, PharmD []  Gretta Arab, PharmD []  Theodis Shove, PharmD []  Peggyann Juba, PharmD []  Reuel Boom, PharmD   Positive urine culture Treated with Cephalexin, organism sensitive to the same and no further patient follow-up is required at this time.  Sandi Raveling Icarus Partch 04/19/2021, 10:55 AM

## 2021-12-22 ENCOUNTER — Encounter (HOSPITAL_BASED_OUTPATIENT_CLINIC_OR_DEPARTMENT_OTHER): Payer: Self-pay | Admitting: Nurse Practitioner

## 2021-12-22 ENCOUNTER — Ambulatory Visit (INDEPENDENT_AMBULATORY_CARE_PROVIDER_SITE_OTHER): Payer: 59 | Admitting: Nurse Practitioner

## 2021-12-22 VITALS — BP 117/72 | HR 85 | Ht 64.0 in | Wt 137.0 lb

## 2021-12-22 DIAGNOSIS — R87619 Unspecified abnormal cytological findings in specimens from cervix uteri: Secondary | ICD-10-CM

## 2021-12-22 DIAGNOSIS — E559 Vitamin D deficiency, unspecified: Secondary | ICD-10-CM | POA: Diagnosis not present

## 2021-12-22 DIAGNOSIS — Z0000 Encounter for general adult medical examination without abnormal findings: Secondary | ICD-10-CM | POA: Diagnosis not present

## 2021-12-22 DIAGNOSIS — N941 Unspecified dyspareunia: Secondary | ICD-10-CM | POA: Insufficient documentation

## 2021-12-22 HISTORY — DX: Unspecified abnormal cytological findings in specimens from cervix uteri: R87.619

## 2021-12-22 NOTE — Progress Notes (Signed)
Tammy Render, DNP, AGNP-c Primary Care [facility] Sports Medicine [address]  [place], [place] [address] [phone]  New patient visit   Patient: Tammy Colon   DOB: 1970-08-08   51 y.o. Female  MRN: [id number] Visit Date: 12/22/2021  Patient Care Team: Tammy Render, NP as PCP - General (Nurse Practitioner)  Today's Vitals   12/22/21 1321  BP: 117/72  Pulse: 85  SpO2: 96%  Weight: 137 lb (62.1 kg)  Height: 5' 4" (1.626 m)   Body mass index is 23.52 kg/m.   Today's healthcare provider: Orma Render, NP   Chief Complaint  Patient presents with   New Patient (Initial Visit)    Patient presents today to establish care. Needs form for new job.    Subjective    Tammy Colon is a 51 y.o. female who presents today as a new patient to establish care.    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  History reviewed and reveals the following: Past Medical History:  Diagnosis Date   Abnormal uterine bleeding    Anemia    Anxiety    GERD (gastroesophageal reflux disease)    no meds   Hormone disorder    Migraine with aura    blurred vision   Past Surgical History:  Procedure Laterality Date   ABDOMINAL HYSTERECTOMY     CYSTOSCOPY N/A 09/20/2016   Procedure: CYSTOSCOPY possible;  Surgeon: Salvadore Dom, MD;  Location: [facility];  Service: Gynecology;  Laterality: N/A;  possible cysto   hysteroscopic myomectomy     LAPAROSCOPIC BILATERAL SALPINGECTOMY Bilateral 09/20/2016   Procedure: LAPAROSCOPIC BILATERAL SALPINGECTOMY;  Surgeon: Salvadore Dom, MD;  Location: [facility];  Service: Gynecology;  Laterality: Bilateral;   LAPAROSCOPIC HYSTERECTOMY N/A 09/20/2016   Procedure: HYSTERECTOMY TOTAL LAPAROSCOPIC;  Surgeon: Salvadore Dom, MD;   Location: [facility];  Service: Gynecology;  Laterality: N/A;   TUBAL LIGATION     Family Status  Relation Name Status   Mat Aunt  (Not Specified)   Daughter Tammy Colon (Not Specified)   Family History  Problem Relation Age of Onset   Lupus Maternal Aunt    Diabetes Daughter    Social History   Socioeconomic History   Marital status: Married    Spouse name: Not on file   Number of children: 3   Years of education: Not on file   Highest education level: Some college, no degree  Occupational History   Not on file  Tobacco Use   Smoking status: Former    Types: Cigars, E-cigarettes    Quit date: 09/26/2017    Years since quitting: 4.2   Smokeless tobacco: Never   Tobacco comments:    vapor   Vaping Use   Vaping Use: Some days   Substances: Nicotine, Flavoring   Devices: blu  Substance and Sexual Activity   Alcohol use: Yes    Alcohol/week: 5.0 standard drinks of alcohol    Types: 5 Glasses of wine per week   Drug use: No   Sexual activity: Yes    Partners: Male    Birth control/protection: Surgical  Other Topics Concern   Not on file  Social History Narrative   Not on file   Social Determinants of Health   Financial Resource Strain: Not on file  Food Insecurity:  Not on file  Transportation Needs: No Transportation Needs (07/04/2018)   PRAPARE - Hydrologist (Medical): No    Lack of Transportation (Non-Medical): No  Physical Activity: Not on file  Stress: Not on file  Social Connections: Not on file   Outpatient Medications Prior to Visit  Medication Sig   albuterol (VENTOLIN HFA) 108 (90 Base) MCG/ACT inhaler Inhale 2 puffs into the lungs every 6 (six) hours as needed for wheezing or shortness of breath.   [DISCONTINUED] methocarbamol (ROBAXIN) 500 MG tablet Take 1 tablet (500 mg total) by mouth 2 (two) times daily.   [DISCONTINUED] naproxen (NAPROSYN) 500 MG tablet Take 1 tablet (500 mg total) by mouth 2 (two) times daily.    No facility-administered medications prior to visit.   No Known Allergies Immunization History  Administered Date(s) Administered   Tdap 08/24/2016    Review of Systems All review of systems negative except what is listed in the HPI   Objective    BP 117/72   Pulse 85   Ht 5' 4" (1.626 m)   Wt 137 lb (62.1 kg)   LMP 09/11/2016 (Exact Date)   SpO2 96%   BMI 23.52 kg/m  Physical Exam Vitals and nursing note reviewed.  Constitutional:      General: She is not in acute distress.    Appearance: Normal appearance.  HENT:     Head: Normocephalic and atraumatic.     Right Ear: Hearing, tympanic membrane, ear canal and external ear normal.     Left Ear: Hearing, tympanic membrane, ear canal and external ear normal.     Nose: Nose normal.     Right Sinus: No maxillary sinus tenderness or frontal sinus tenderness.     Left Sinus: No maxillary sinus tenderness or frontal sinus tenderness.     Mouth/Throat:     Lips: Pink.     Mouth: Mucous membranes are moist.     Pharynx: Oropharynx is clear.  Eyes:     General: Lids are normal. Vision grossly intact.     Extraocular Movements: Extraocular movements intact.     Conjunctiva/sclera: Conjunctivae normal.     Pupils: Pupils are equal, round, and reactive to light.     Funduscopic exam:    Right eye: Red reflex present.        Left eye: Red reflex present.    Visual Fields: Right eye visual fields normal and left eye visual fields normal.  Neck:     Thyroid: No thyromegaly.     Vascular: No carotid bruit.  Cardiovascular:     Rate and Rhythm: Normal rate and regular rhythm.     Chest Wall: PMI is not displaced.     Pulses: Normal pulses.          Dorsalis pedis pulses are 2+ on the right side and 2+ on the left side.       Posterior tibial pulses are 2+ on the right side and 2+ on the left side.     Heart sounds: Normal heart sounds. No murmur heard. Pulmonary:     Effort: Pulmonary effort is normal. No respiratory  distress.     Breath sounds: Normal breath sounds.  Abdominal:     General: Abdomen is flat. Bowel sounds are normal. There is no distension.     Palpations: Abdomen is soft. There is no hepatomegaly, splenomegaly or mass.     Tenderness: There is no abdominal tenderness. There is no right CVA  tenderness, left CVA tenderness, guarding or rebound.  Musculoskeletal:        General: Normal range of motion.     Cervical back: Full passive range of motion without pain, normal range of motion and neck supple. No tenderness.     Right lower leg: No edema.     Left lower leg: No edema.  Feet:     Left foot:     Toenail Condition: Left toenails are normal.  Lymphadenopathy:     Cervical: No cervical adenopathy.     Upper Body:     Right upper body: No supraclavicular adenopathy.     Left upper body: No supraclavicular adenopathy.  Skin:    General: Skin is warm and dry.     Capillary Refill: Capillary refill takes less than 2 seconds.     Nails: There is no clubbing.  Neurological:     General: No focal deficit present.     Mental Status: She is alert and oriented to person, place, and time.     GCS: GCS eye subscore is 4. GCS verbal subscore is 5. GCS motor subscore is 6.     Sensory: Sensation is intact.     Motor: Motor function is intact.     Coordination: Coordination is intact.     Gait: Gait is intact.     Deep Tendon Reflexes: Reflexes are normal and symmetric.  Psychiatric:        Attention and Perception: Attention normal.        Mood and Affect: Mood normal.        Speech: Speech normal.        Behavior: Behavior normal. Behavior is cooperative.        Thought Content: Thought content normal.        Cognition and Memory: Cognition and memory normal.        Judgment: Judgment normal.     Results for orders placed or performed in visit on 12/22/21  CBC With Diff/Platelet  Result Value Ref Range   WBC 4.3 3.4 - 10.8 x10E3/uL   RBC 4.16 3.77 - 5.28 x10E6/uL   Hemoglobin  13.0 11.1 - 15.9 g/dL   Hematocrit 38.4 34.0 - 46.6 %   MCV 92 79 - 97 fL   MCH 31.3 26.6 - 33.0 pg   MCHC 33.9 31.5 - 35.7 g/dL   RDW 12.6 11.7 - 15.4 %   Platelets 385 150 - 450 x10E3/uL   Neutrophils 52 Not Estab. %   Lymphs 31 Not Estab. %   Monocytes 11 Not Estab. %   Eos 5 Not Estab. %   Basos 1 Not Estab. %   Neutrophils Absolute 2.3 1.4 - 7.0 x10E3/uL   Lymphocytes Absolute 1.3 0.7 - 3.1 x10E3/uL   Monocytes Absolute 0.5 0.1 - 0.9 x10E3/uL   EOS (ABSOLUTE) 0.2 0.0 - 0.4 x10E3/uL   Basophils Absolute 0.0 0.0 - 0.2 x10E3/uL   Immature Granulocytes 0 Not Estab. %   Immature Grans (Abs) 0.0 0.0 - 0.1 x10E3/uL  Comprehensive metabolic panel  Result Value Ref Range   Glucose 99 70 - 99 mg/dL   BUN 12 6 - 24 mg/dL   Creatinine, Ser 0.74 0.57 - 1.00 mg/dL   eGFR 100 >59 mL/min/1.73   BUN/Creatinine Ratio 16 9 - 23   Sodium 143 134 - 144 mmol/L   Potassium 4.8 3.5 - 5.2 mmol/L   Chloride 105 96 - 106 mmol/L   CO2 19 (L) 20 - 29 mmol/L  Calcium 10.1 8.7 - 10.2 mg/dL   Total Protein 7.5 6.0 - 8.5 g/dL   Albumin 4.5 3.8 - 4.8 g/dL   Globulin, Total 3.0 1.5 - 4.5 g/dL   Albumin/Globulin Ratio 1.5 1.2 - 2.2   Bilirubin Total 0.7 0.0 - 1.2 mg/dL   Alkaline Phosphatase 65 44 - 121 IU/L   AST 30 0 - 40 IU/L   ALT 22 0 - 32 IU/L  Hemoglobin A1c  Result Value Ref Range   Hgb A1c MFr Bld 4.9 4.8 - 5.6 %   Est. average glucose Bld gHb Est-mCnc 94 mg/dL  VITAMIN D 25 Hydroxy (Vit-D Deficiency, Fractures)  Result Value Ref Range   Vit D, 25-Hydroxy 9.8 (L) 30.0 - 100.0 ng/mL  Thyroid Panel With TSH  Result Value Ref Range   TSH 1.070 0.450 - 4.500 uIU/mL   T4, Total 5.8 4.5 - 12.0 ug/dL   T3 Uptake Ratio 27 24 - 39 %   Free Thyroxine Index 1.6 1.2 - 4.9  Lipid panel  Result Value Ref Range   Cholesterol, Total 217 (H) 100 - 199 mg/dL   Triglycerides 104 0 - 149 mg/dL   HDL 101 >39 mg/dL   VLDL Cholesterol Cal 18 5 - 40 mg/dL   LDL Chol Calc (NIH) 98 0 - 99 mg/dL    Chol/HDL Ratio 2.1 0.0 - 4.4 ratio    Assessment & Plan      Problem List Items Addressed This Visit     Encounter for annual physical exam - Primary    New patient presents for physical exam for employment requirement.  TB test and laboratory examination as part of the appointment process to be completed as well. No abnormal findings on physical exam that would warrant any concerns for employment at this time. We will review laboratory work once completed and make changes to plan of care as necessary based on the findings as appropriate. We will work to obtain previous records to update health maintenance recommendations as appropriate. Patient will plan to follow-up in 1 year for physical exam or sooner if needed.      Other Visit Diagnoses     Healthcare maintenance       Relevant Orders   CBC With Diff/Platelet (Completed)   Comprehensive metabolic panel (Completed)   Hemoglobin A1c (Completed)   VITAMIN D 25 Hydroxy (Vit-D Deficiency, Fractures) (Completed)   Thyroid Panel With TSH (Completed)   Lipid panel (Completed)   QuantiFERON-TB Gold Plus   HBsAb Quant HBIG Assessment   Measles/Mumps/Rubella Immunity   Vitamin D deficiency       Relevant Medications   Vitamin D, Ergocalciferol, (DRISDOL) 1.25 MG (50000 UNIT) CAPS capsule   Other Relevant Orders   VITAMIN D 25 Hydroxy (Vit-D Deficiency, Fractures)        No follow-ups on file.      Tammy Colon, Coralee Pesa, NP, DNP, AGNP-C [facility] at [facility]

## 2021-12-23 LAB — CBC WITH DIFF/PLATELET
Basophils Absolute: 0 10*3/uL (ref 0.0–0.2)
Basos: 1 %
EOS (ABSOLUTE): 0.2 10*3/uL (ref 0.0–0.4)
Eos: 5 %
Hematocrit: 38.4 % (ref 34.0–46.6)
Hemoglobin: 13 g/dL (ref 11.1–15.9)
Immature Grans (Abs): 0 10*3/uL (ref 0.0–0.1)
Immature Granulocytes: 0 %
Lymphocytes Absolute: 1.3 10*3/uL (ref 0.7–3.1)
Lymphs: 31 %
MCH: 31.3 pg (ref 26.6–33.0)
MCHC: 33.9 g/dL (ref 31.5–35.7)
MCV: 92 fL (ref 79–97)
Monocytes Absolute: 0.5 10*3/uL (ref 0.1–0.9)
Monocytes: 11 %
Neutrophils Absolute: 2.3 10*3/uL (ref 1.4–7.0)
Neutrophils: 52 %
Platelets: 385 10*3/uL (ref 150–450)
RBC: 4.16 x10E6/uL (ref 3.77–5.28)
RDW: 12.6 % (ref 11.7–15.4)
WBC: 4.3 10*3/uL (ref 3.4–10.8)

## 2021-12-23 LAB — LIPID PANEL
Chol/HDL Ratio: 2.1 ratio (ref 0.0–4.4)
Cholesterol, Total: 217 mg/dL — ABNORMAL HIGH (ref 100–199)
HDL: 101 mg/dL (ref >39)
LDL Chol Calc (NIH): 98 mg/dL (ref 0–99)
Triglycerides: 104 mg/dL (ref 0–149)
VLDL Cholesterol Cal: 18 mg/dL (ref 5–40)

## 2021-12-23 LAB — COMPREHENSIVE METABOLIC PANEL
ALT: 22 IU/L (ref 0–32)
AST: 30 IU/L (ref 0–40)
Albumin/Globulin Ratio: 1.5 (ref 1.2–2.2)
Albumin: 4.5 g/dL (ref 3.8–4.8)
Alkaline Phosphatase: 65 IU/L (ref 44–121)
BUN/Creatinine Ratio: 16 (ref 9–23)
BUN: 12 mg/dL (ref 6–24)
Bilirubin Total: 0.7 mg/dL (ref 0.0–1.2)
CO2: 19 mmol/L — ABNORMAL LOW (ref 20–29)
Calcium: 10.1 mg/dL (ref 8.7–10.2)
Chloride: 105 mmol/L (ref 96–106)
Creatinine, Ser: 0.74 mg/dL (ref 0.57–1.00)
Globulin, Total: 3 g/dL (ref 1.5–4.5)
Glucose: 99 mg/dL (ref 70–99)
Potassium: 4.8 mmol/L (ref 3.5–5.2)
Sodium: 143 mmol/L (ref 134–144)
Total Protein: 7.5 g/dL (ref 6.0–8.5)
eGFR: 100 mL/min/{1.73_m2} (ref >59)

## 2021-12-23 LAB — VITAMIN D 25 HYDROXY (VIT D DEFICIENCY, FRACTURES): Vit D, 25-Hydroxy: 9.8 ng/mL — ABNORMAL LOW (ref 30.0–100.0)

## 2021-12-23 LAB — THYROID PANEL WITH TSH
Free Thyroxine Index: 1.6 (ref 1.2–4.9)
T3 Uptake Ratio: 27 % (ref 24–39)
T4, Total: 5.8 ug/dL (ref 4.5–12.0)
TSH: 1.07 u[IU]/mL (ref 0.450–4.500)

## 2021-12-23 LAB — HEMOGLOBIN A1C
Est. average glucose Bld gHb Est-mCnc: 94 mg/dL
Hgb A1c MFr Bld: 4.9 % (ref 4.8–5.6)

## 2021-12-25 MED ORDER — VITAMIN D (ERGOCALCIFEROL) 1.25 MG (50000 UNIT) PO CAPS: 50000.0000 [IU] | ORAL_CAPSULE | ORAL | 1 refills | Status: DC

## 2021-12-30 NOTE — Progress Notes (Signed)
LVM for pt to schedule repeat vitamin D lab only in 4 months.

## 2022-01-11 ENCOUNTER — Encounter (HOSPITAL_BASED_OUTPATIENT_CLINIC_OR_DEPARTMENT_OTHER): Payer: Self-pay | Admitting: Nurse Practitioner

## 2022-01-11 DIAGNOSIS — Z0000 Encounter for general adult medical examination without abnormal findings: Secondary | ICD-10-CM | POA: Insufficient documentation

## 2022-01-11 NOTE — Assessment & Plan Note (Signed)
New patient presents for physical exam for employment requirement.  TB test and laboratory examination as part of the appointment process to be completed as well. No abnormal findings on physical exam that would warrant any concerns for employment at this time. We will review laboratory work once completed and make changes to plan of care as necessary based on the findings as appropriate. We will work to obtain previous records to update health maintenance recommendations as appropriate. Patient will plan to follow-up in 1 year for physical exam or sooner if needed.

## 2022-01-12 ENCOUNTER — Telehealth (HOSPITAL_BASED_OUTPATIENT_CLINIC_OR_DEPARTMENT_OTHER): Payer: Self-pay

## 2022-01-12 NOTE — Telephone Encounter (Signed)
Patient left two voicemail on nurse line in regard to TB result. CMA never released order. Patient will require redraw.   Please call patient to schedule nurse visit

## 2022-01-13 NOTE — Telephone Encounter (Signed)
Spoke to pt and scheduled her for nurse visit on 7/21 at 10:40 am to redo tb test.

## 2022-01-15 ENCOUNTER — Ambulatory Visit (HOSPITAL_BASED_OUTPATIENT_CLINIC_OR_DEPARTMENT_OTHER): Payer: 59

## 2022-01-15 ENCOUNTER — Other Ambulatory Visit (HOSPITAL_BASED_OUTPATIENT_CLINIC_OR_DEPARTMENT_OTHER): Payer: Self-pay | Admitting: Nurse Practitioner

## 2022-01-19 LAB — QUANTIFERON-TB GOLD PLUS
QuantiFERON Mitogen Value: >10 IU/mL
QuantiFERON Nil Value: 0.03 IU/mL
QuantiFERON TB1 Ag Value: 0.03 IU/mL
QuantiFERON TB2 Ag Value: 0 IU/mL
QuantiFERON-TB Gold Plus: NEGATIVE

## 2022-03-22 ENCOUNTER — Other Ambulatory Visit (HOSPITAL_BASED_OUTPATIENT_CLINIC_OR_DEPARTMENT_OTHER): Payer: Self-pay

## 2022-03-22 DIAGNOSIS — E559 Vitamin D deficiency, unspecified: Secondary | ICD-10-CM

## 2022-03-22 MED ORDER — VITAMIN D (ERGOCALCIFEROL) 1.25 MG (50000 UNIT) PO CAPS: 50000.0000 [IU] | ORAL_CAPSULE | ORAL | 1 refills | Status: DC

## 2023-01-07 ENCOUNTER — Ambulatory Visit (HOSPITAL_BASED_OUTPATIENT_CLINIC_OR_DEPARTMENT_OTHER): Payer: Self-pay | Admitting: Family Medicine

## 2023-01-11 ENCOUNTER — Encounter (HOSPITAL_BASED_OUTPATIENT_CLINIC_OR_DEPARTMENT_OTHER): Payer: Self-pay | Admitting: Family Medicine

## 2023-01-11 ENCOUNTER — Ambulatory Visit (INDEPENDENT_AMBULATORY_CARE_PROVIDER_SITE_OTHER): Payer: Commercial Managed Care - HMO | Admitting: Family Medicine

## 2023-01-11 VITALS — BP 157/111 | HR 63 | Ht 64.0 in | Wt 143.0 lb

## 2023-01-11 DIAGNOSIS — E559 Vitamin D deficiency, unspecified: Secondary | ICD-10-CM | POA: Diagnosis not present

## 2023-01-11 DIAGNOSIS — R5383 Other fatigue: Secondary | ICD-10-CM

## 2023-01-11 DIAGNOSIS — R030 Elevated blood-pressure reading, without diagnosis of hypertension: Secondary | ICD-10-CM

## 2023-01-11 MED ORDER — ALBUTEROL SULFATE HFA 108 (90 BASE) MCG/ACT IN AERS: 2.0000 | INHALATION_SPRAY | Freq: Four times a day (QID) | RESPIRATORY_TRACT | 2 refills | Status: AC | PRN

## 2023-01-11 NOTE — Progress Notes (Signed)
Established Patient Office Visit  Subjective   Patient ID: Tammy Colon, female    DOB: [birth year]  Age: 52 y.o. MRN: [id number]  Chief Complaint  Patient presents with   Headache    Thinks it may be from her blood pressure   Tammy Colon is a 52 year-old female patient who presents today for concerns about her blood pressure.    She has "felt this for a long time" but reports she never brought it up at any office visits. She reports that she will sometimes feel slightly dizzy, have occasional headaches, feeling tired, changes in vision- specifically blurred vision.  After COVID, reports noticing issues with occasional breathing, memory issues, and decrease in appetite.   Reports noticing issues with an increase in anxiety, inattentiveness, fatigue, and headaches.   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 She was advised to take BP at home but has not been taking it. She is currently not on medication for BP. Only has manual cuff at home- looking at purchasing cuff.  Adhering to low sodium diet: not currently  Exercising Regularly: not currently   Her ex-husband died suddenly this past Nov 01, 2022. This has been her most recent life stressor.   BP Readings from Last 3 Encounters:  01/11/23 (!) 157/111  12/22/21 117/72  04/14/21 104/69       01/11/2023    4:05 PM  GAD 7 : Generalized Anxiety Score  Nervous, Anxious, on Edge 2  Control/stop worrying 3  Worry too much - different things 3  Trouble relaxing 3  Restless 2  Easily annoyed or irritable 3  Afraid - awful might happen 3  Total GAD 7 Score 19  Anxiety Difficulty Somewhat difficult       01/11/2023    4:05 PM 12/22/2021    1:22 PM  Depression screen PHQ 2/9  Decreased Interest 2 0  Down, Depressed, Hopeless 2 0  PHQ - 2 Score 4 0  Altered sleeping 3   Tired, decreased energy 3   Change in appetite 3   Feeling bad or failure about yourself  1    Trouble concentrating 1   Moving slowly or fidgety/restless 0   Suicidal thoughts 0   PHQ-9 Score 15   Difficult doing work/chores Somewhat difficult      Review of Systems  Constitutional:  Negative for malaise/fatigue and weight loss.  Eyes:  Positive for blurred vision (worsened since after COVID). Negative for double vision.  Respiratory:  Positive for shortness of breath. Negative for cough.   Cardiovascular:  Positive for chest pain and palpitations. Negative for leg swelling.  Gastrointestinal:  Negative for abdominal pain, nausea and vomiting.  Musculoskeletal:  Negative for myalgias.  Neurological:  Positive for dizziness and headaches. Negative for speech change, focal weakness and weakness.  Psychiatric/Behavioral:  Positive for depression. Negative for suicidal ideas. The patient is nervous/anxious. The patient does not have insomnia.     Objective:    BP (!) 157/111   Pulse 63   Ht 5\' 4"  (1.626 m)   Wt 143 lb (64.9 kg)   LMP 09/11/2016 (Exact Date)   SpO2 100%   BMI 24.55 kg/m  BP Readings from Last 3 Encounters:  01/11/23 (!) 157/111  12/22/21 117/72  04/14/21 104/69    Physical Exam Constitutional:      Appearance: Normal appearance.  Cardiovascular:     Rate and Rhythm: Normal rate and regular rhythm.  Pulses: Normal pulses.     Heart sounds: Normal heart sounds.  Pulmonary:     Effort: Pulmonary effort is normal.     Breath sounds: Normal breath sounds.  Musculoskeletal:     Right lower leg: No edema.     Left lower leg: No edema.  Neurological:     Mental Status: She is alert.  Psychiatric:        Mood and Affect: Mood normal.        Behavior: Behavior normal.        Thought Content: Thought content normal.        Judgment: Judgment normal.      Assessment & Plan:   1. Elevated blood pressure reading in office without diagnosis of hypertension Patient presents today with elevated blood pressure, recheck still elevated. Patient in no  acute distress and is well-appearing. Denies acute chest pain, shortness of breath, lower extremity edema. Reports she has a slight headache and has been dealing with blurred vision. Cardiovascular exam with heart regular rate and rhythm. Normal heart sounds, no murmurs present. No lower extremity edema present. Lungs clear to auscultation bilaterally. Patient is not currently taking any medications for her blood pressure. Advised patient to closely monitor blood pressure at home. Discussed ER precautions. Plan to check CMP prior to initiation of pharmacotherapy. Follow-up in 2 weeks.    - Comprehensive metabolic panel  2. Fatigue, unspecified type Patient reports she has been dealing with fatigue for the past few months. Reports she realizes this might be related to her elevated blood pressure.  - Comprehensive metabolic panel  3. Vitamin D deficiency Patient has a history of vitamin D deficiency and was prescribed high-dose vitamin D and did not have this repeated. Reasonable to repeat vitamin D today and will treat accordingly.  - VITAMIN D 25 Hydroxy (Vit-D Deficiency, Fractures)   Return in about 2 weeks (around 01/25/2023) for HTN follow-up.    Alyson Reedy, FNP

## 2023-01-12 LAB — COMPREHENSIVE METABOLIC PANEL
ALT: 18 IU/L (ref 0–32)
AST: 19 IU/L (ref 0–40)
Albumin: 4.3 g/dL (ref 3.9–4.9)
Alkaline Phosphatase: 50 IU/L (ref 44–121)
BUN/Creatinine Ratio: 13 (ref 9–23)
BUN: 9 mg/dL (ref 6–24)
Bilirubin Total: 0.6 mg/dL (ref 0.0–1.2)
CO2: 22 mmol/L (ref 20–29)
Calcium: 10.1 mg/dL (ref 8.7–10.2)
Chloride: 105 mmol/L (ref 96–106)
Creatinine, Ser: 0.72 mg/dL (ref 0.57–1.00)
Globulin, Total: 2.4 g/dL (ref 1.5–4.5)
Glucose: 95 mg/dL (ref 70–99)
Potassium: 4.7 mmol/L (ref 3.5–5.2)
Sodium: 141 mmol/L (ref 134–144)
Total Protein: 6.7 g/dL (ref 6.0–8.5)
eGFR: 102 mL/min/{1.73_m2} (ref >59)

## 2023-01-12 LAB — VITAMIN D 25 HYDROXY (VIT D DEFICIENCY, FRACTURES): Vit D, 25-Hydroxy: 33.3 ng/mL (ref 30.0–100.0)

## 2023-01-25 ENCOUNTER — Ambulatory Visit (HOSPITAL_BASED_OUTPATIENT_CLINIC_OR_DEPARTMENT_OTHER): Payer: Commercial Managed Care - HMO | Admitting: Family Medicine

## 2023-01-25 NOTE — Progress Notes (Deleted)
   Established Patient Office Visit  Subjective   Patient ID: Tammy Colon, female    DOB: 1971/02/18  Age: 52 y.o. MRN: [id number]  No chief complaint on file.  HYPERTENSION: Tammy Colon is a 52 year-old female patient who presents for the medical management of hypertension.   Patient is not currently taking prescribed medications for HTN.  Patient is regularly keeping a check on BP at home. Home BP readings include:   Adhering to low sodium diet: *** Exercising Regularly: *** Denies headache, dizziness, CP, SHOB, vision changes.   BP Readings from Last 3 Encounters:  01/11/23 (!) 157/111  12/22/21 117/72  04/14/21 104/69    ROS    Objective:     LMP 09/11/2016 (Exact Date)  BP Readings from Last 3 Encounters:  01/11/23 (!) 157/111  12/22/21 117/72  04/14/21 104/69     Physical Exam    Assessment & Plan:  There are no diagnoses linked to this encounter.   No follow-ups on file.    Alyson Reedy, FNP

## 2023-01-26 ENCOUNTER — Ambulatory Visit (HOSPITAL_BASED_OUTPATIENT_CLINIC_OR_DEPARTMENT_OTHER): Payer: Commercial Managed Care - HMO | Admitting: Family Medicine

## 2023-01-26 ENCOUNTER — Encounter (HOSPITAL_BASED_OUTPATIENT_CLINIC_OR_DEPARTMENT_OTHER): Payer: Self-pay | Admitting: Family Medicine

## 2023-01-26 VITALS — BP 138/96 | HR 64 | Ht 64.0 in | Wt 140.0 lb

## 2023-01-26 DIAGNOSIS — F5101 Primary insomnia: Secondary | ICD-10-CM | POA: Insufficient documentation

## 2023-01-26 DIAGNOSIS — I10 Essential (primary) hypertension: Secondary | ICD-10-CM | POA: Diagnosis not present

## 2023-01-26 HISTORY — DX: Primary insomnia: F51.01

## 2023-01-26 MED ORDER — AMLODIPINE BESYLATE 2.5 MG PO TABS: 2.5000 mg | ORAL_TABLET | Freq: Every day | ORAL | 1 refills | Status: DC

## 2023-01-26 NOTE — Patient Instructions (Signed)
HEALTHY SLEEP Sleep hygiene: Basic rules for a good night's sleep  Sleep only as much as you need to feel rested and then get out of bed  Keep a regular sleep schedule  Avoid forcing sleep  Exercise regularly for at least 20 minutes, preferably 4 to 5 hours before bedtime  Avoid caffeinated beverages after lunch  Avoid alcohol near bedtime: no "night cap"  Avoid smoking, especially in the evening  Do not go to bed hungry  Adjust bedroom environment  Avoid prolonged use of light-emitting screens before bedtime   Deal with your worries before bedtime     

## 2023-01-26 NOTE — Progress Notes (Signed)
Established Patient Office Visit  Subjective   Patient ID: Tammy Colon, female    DOB: 12/05/1970  Age: 52 y.o. MRN: [id number]  HYPERTENSION: Tammy Colon is a 52 year-old female patient who presents for the medical management of hypertension.  Patient is not currently taking prescribed medications for HTN.  Patient is regularly keeping a check on BP at home. Home BP readings include:  7/10: 142/102 7/17: 128/82 AM           140/90 PM 7/22: 152/104 AM           144/92 7/31: 124/82 AM           142/82 PM  Adhering to low sodium diet: yes Exercising Regularly: occasionally   Denies headache, dizziness, CP, SHOB, vision changes.    BP Readings from Last 3 Encounters:  01/26/23 (!) 138/96  01/11/23 (!) 157/111  12/22/21 117/72   She would like to discuss INSOMNIA.  Duration: years Average hours of sleep: 4-5  Difficulty falling asleep: yes Difficulty staying asleep: yes Treatments tried: melatonin Recent stress: yes Daytime sleepiness: no Apnea: no Snoring: no History of sleep study: no   Review of Systems  Constitutional:  Negative for malaise/fatigue.  Eyes:  Negative for blurred vision and double vision.  Respiratory:  Negative for cough and shortness of breath.   Cardiovascular:  Negative for chest pain, palpitations, claudication and leg swelling.  Gastrointestinal:  Negative for abdominal pain, nausea and vomiting.  Musculoskeletal:  Negative for myalgias.  Neurological:  Negative for dizziness, speech change, weakness and headaches.  Psychiatric/Behavioral:  Negative for depression and suicidal ideas. The patient is not nervous/anxious.      Objective:    BP (!) 138/96 (BP Location: Left Arm)   Pulse 64   Ht 5\' 4"  (1.626 m)   Wt 140 lb (63.5 kg)   LMP 09/11/2016 (Exact Date)   SpO2 100%   BMI 24.03 kg/m  BP Readings from Last 3 Encounters:  01/26/23 (!) 138/96  01/11/23 (!) 157/111  12/22/21 117/72     Physical Exam Constitutional:       Appearance: Normal appearance.  Cardiovascular:     Rate and Rhythm: Normal rate and regular rhythm.     Pulses: Normal pulses.     Heart sounds: Normal heart sounds.  Pulmonary:     Effort: Pulmonary effort is normal.     Breath sounds: Normal breath sounds.  Neurological:     Mental Status: She is alert.  Psychiatric:        Mood and Affect: Mood normal.        Behavior: Behavior normal.     Assessment & Plan:   1. Primary hypertension Patient presents today with slightly elevated blood pressure and recheck also elevated. Patient in no acute distress and is well-appearing. Denies chest pain, shortness of breath, lower extremity edema, vision changes, headaches. Cardiovascular exam with heart regular rate and rhythm. Normal heart sounds, no murmurs present. No lower extremity edema present. Lungs clear to auscultation bilaterally. Patient is not currently not prescribed pharmacotherapy. Will start patient on amlodipine 2.5mg  daily- discussed medication frequency, common side effects and adverse reactions. Advised patient to closely monitor blood pressure as we change her dose. Return to office sooner if blood pressure begins to increase greater than 130/80. Follow-up in 4 weeks.    - amLODipine (NORVASC) 2.5 MG tablet; Take 1 tablet (2.5 mg total) by mouth daily.  Dispense: 90 tablet; Refill: 1  2. Primary insomnia  Patient reports she has a difficult time falling asleep and staying asleep. She has not tried any OTC medications or sleep hygiene habits. Discussed use of over-the-counter medications, including melatonin, and provided patient with sleep hygiene tips. Plan to follow-up in 4 weeks to see if she notices an improvement with sleep.    Return in about 4 weeks (around 02/23/2023) for HTN follow-up.    Alyson Reedy, FNP

## 2023-02-23 ENCOUNTER — Ambulatory Visit (HOSPITAL_BASED_OUTPATIENT_CLINIC_OR_DEPARTMENT_OTHER): Payer: Commercial Managed Care - HMO | Admitting: Family Medicine

## 2023-04-11 ENCOUNTER — Other Ambulatory Visit (HOSPITAL_BASED_OUTPATIENT_CLINIC_OR_DEPARTMENT_OTHER): Payer: Self-pay | Admitting: *Deleted

## 2023-04-11 DIAGNOSIS — I10 Essential (primary) hypertension: Secondary | ICD-10-CM

## 2023-04-11 MED ORDER — AMLODIPINE BESYLATE 2.5 MG PO TABS: 2.5000 mg | ORAL_TABLET | Freq: Every day | ORAL | 1 refills | Status: DC

## 2023-05-19 ENCOUNTER — Encounter (HOSPITAL_BASED_OUTPATIENT_CLINIC_OR_DEPARTMENT_OTHER): Payer: Self-pay | Admitting: Family Medicine

## 2023-05-30 ENCOUNTER — Encounter (HOSPITAL_BASED_OUTPATIENT_CLINIC_OR_DEPARTMENT_OTHER): Payer: Self-pay | Admitting: Family Medicine

## 2023-10-21 ENCOUNTER — Encounter (HOSPITAL_BASED_OUTPATIENT_CLINIC_OR_DEPARTMENT_OTHER): Payer: Self-pay

## 2024-01-12 ENCOUNTER — Telehealth (HOSPITAL_BASED_OUTPATIENT_CLINIC_OR_DEPARTMENT_OTHER): Payer: Self-pay | Admitting: *Deleted

## 2024-01-12 NOTE — Telephone Encounter (Signed)
 noted

## 2024-01-12 NOTE — Telephone Encounter (Signed)
 Copied from CRM [id number]. Topic: Appointments - Transfer of Care >> Jan 12, 2024 10:45 AM Donna BRAVO wrote: Pt is requesting to transfer FROM: Evalene Arts FNP-C  Pt is requesting to transfer TO: [facility] to [employer] FNP-C  Reason for requested transfer: N/A  It is the responsibility of the team the patient would like to transfer to [employer], FNP-C  to reach out to the patient if for any reason this transfer is not acceptable.

## 2024-01-17 ENCOUNTER — Other Ambulatory Visit (HOSPITAL_COMMUNITY)
Admission: RE | Admit: 2024-01-17 | Discharge: 2024-01-17 | Disposition: A | Discharge status: Home / Self Care | Payer: Self-pay | Source: Ambulatory Visit | Attending: Family Medicine | Admitting: Family Medicine

## 2024-01-17 ENCOUNTER — Ambulatory Visit (INDEPENDENT_AMBULATORY_CARE_PROVIDER_SITE_OTHER): Payer: Self-pay | Admitting: Family Medicine

## 2024-01-17 VITALS — BP 160/98 | HR 57 | Ht 64.0 in | Wt 140.0 lb

## 2024-01-17 DIAGNOSIS — I10 Essential (primary) hypertension: Secondary | ICD-10-CM

## 2024-01-17 DIAGNOSIS — R1030 Lower abdominal pain, unspecified: Secondary | ICD-10-CM

## 2024-01-17 LAB — POCT URINALYSIS DIP (CLINITEK)
Bilirubin, UA: NEGATIVE
Glucose, UA: NEGATIVE mg/dL
Ketones, POC UA: NEGATIVE mg/dL
Nitrite, UA: POSITIVE — AB
POC PROTEIN,UA: NEGATIVE
Spec Grav, UA: 1.02 (ref 1.010–1.025)
Urobilinogen, UA: 0.2 U/dL
pH, UA: 6 (ref 5.0–8.0)

## 2024-01-17 LAB — POCT URINE PREGNANCY: Preg Test, Ur: NEGATIVE

## 2024-01-17 MED ORDER — AMLODIPINE-OLMESARTAN 5-20 MG PO TABS: 1.0000 | ORAL_TABLET | Freq: Every day | ORAL | 3 refills | Status: AC

## 2024-01-17 MED ORDER — NITROFURANTOIN MONOHYD MACRO 100 MG PO CAPS: 100.0000 mg | ORAL_CAPSULE | Freq: Two times a day (BID) | ORAL | 0 refills | Status: AC

## 2024-01-17 NOTE — Progress Notes (Signed)
 Acute Care Office Visit  Subjective:   Tammy Colon Jul 27, 1970 01/17/2024  Chief Complaint  Patient presents with   Abdominal Pain    Pt has been having right lower abdominal pain which began about 1 week ago.    HPI: ABDOMINAL PAIN: Onset: 2 weeks ago  Location: diffuse lower abdomen Description of pain: sharp Radiation: denies  Severity: 0/10 at this time, 10/10 at its worst  Alleviating factors: denies Aggravating factors: denies Treatments tried: BC powder  Fever: no Nausea: yes - after consuming alcoholic beverage  Vomiting: no  Weight loss: no Decreased appetite: no Diarrhea: no Constipation: no Blood in stool: no Heartburn: no Dysuria/urinary frequency: no Hematuria: no History of sexually transmitted disease: no Recurrent NSAID use: no  Last unprotected sexual intercourse was 1day ago.  She consumes alcohol 3 days a week per pt.   BLOOD PRESSURE: Pt is prescribed Amlodipine  2.5mg  for blood pressure control. Pt's BP is noted to be elevated today, states she doesn't take her medication on a regular basis. She denies blurry vision, headaches, chest pain, palpitations, SOB.   The following portions of the patient's history were reviewed and updated as appropriate: past medical history, past surgical history, family history, social history, allergies, medications, and problem list.   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   Past Medical History:  Diagnosis Date   Abnormal cervical Papanicolaou smear 12/22/2021   Abnormal uterine bleeding    Anemia    Anxiety    GERD (gastroesophageal reflux disease)    no meds   Hormone disorder    Irregular menses 04/06/2011   Migraine with aura    blurred vision   Primary insomnia 01/26/2023   Past Surgical History:  Procedure Laterality Date   ABDOMINAL HYSTERECTOMY     CYSTOSCOPY N/A 09/20/2016   Procedure: CYSTOSCOPY possible;   Surgeon: Kate Hargis Nearing, MD;  Location: [facility];  Service: Gynecology;  Laterality: N/A;  possible cysto   hysteroscopic myomectomy     LAPAROSCOPIC BILATERAL SALPINGECTOMY Bilateral 09/20/2016   Procedure: LAPAROSCOPIC BILATERAL SALPINGECTOMY;  Surgeon: Kate Hargis Nearing, MD;  Location: [facility];  Service: Gynecology;  Laterality: Bilateral;   LAPAROSCOPIC HYSTERECTOMY N/A 09/20/2016   Procedure: HYSTERECTOMY TOTAL LAPAROSCOPIC;  Surgeon: Kate Hargis Nearing, MD;  Location: [facility];  Service: Gynecology;  Laterality: N/A;   TUBAL LIGATION     Family History  Problem Relation Age of Onset   Lupus Maternal Aunt    Diabetes Daughter    Outpatient Medications Prior to Visit  Medication Sig Dispense Refill   albuterol  (VENTOLIN  HFA) 108 (90 Base) MCG/ACT inhaler Inhale 2 puffs into the lungs every 6 (six) hours as needed for wheezing or shortness of breath. 8 g 2   amLODipine  (NORVASC ) 2.5 MG tablet Take 1 tablet (2.5 mg total) by mouth daily. 90 tablet 1   No facility-administered medications prior to visit.   No Known Allergies   ROS: A complete ROS was performed with pertinent positives/negatives noted in the HPI. The remainder of the ROS are negative.    Objective:   Today's Vitals   01/17/24 1535 01/17/24 1602  BP: (!) 157/103 (!) 160/98  Pulse: (!) 57   SpO2: 100%   Weight: 63.5 kg   Height: 5' 4 (1.626 m)     GENERAL: Well-appearing, in NAD. Well nourished.  SKIN: Pink, warm and dry. No rash, lesion, ulceration, or ecchymoses.  Head: Normocephalic.  NECK: Trachea midline. Full ROM w/o pain or tenderness.  EYES: Conjunctiva clear without exudates. EOMI, PERRL.  THROAT: Mucous membranes pink and moist.  RESPIRATORY: Chest wall symmetrical. Respirations even and non-labored. Breath sounds clear to auscultation bilaterally.  CARDIAC: S1, S2 present, regular rate and rhythm without murmur or gallops. Peripheral pulses 2+ bilaterally.  MSK: Muscle tone and strength  appropriate for age. Joints w/o tenderness, redness, or swelling.  EXTREMITIES: Without clubbing, cyanosis, or edema.  GI: Abdomen soft. Normoactive bowel sounds. No rebound tenderness. No hepatomegaly or splenomegaly. No CVA tenderness. Mild tenderness with palpation diffuse lower abdomen. Mild distention.  NEUROLOGIC: No motor or sensory deficits. Steady, even gait. C2-C12 intact.  PSYCH/MENTAL STATUS: Alert, oriented x 3. Cooperative, appropriate mood and affect.    Results for orders placed or performed in visit on 01/17/24  POCT urine pregnancy  Result Value Ref Range   Preg Test, Ur Negative Negative  POCT URINALYSIS DIP (CLINITEK)  Result Value Ref Range   Color, UA yellow yellow   Clarity, UA clear clear   Glucose, UA negative negative mg/dL   Bilirubin, UA negative negative   Ketones, POC UA negative negative mg/dL   Spec Grav, UA 8.979 8.989 - 1.025   Blood, UA trace-intact (A) negative   pH, UA 6.0 5.0 - 8.0   POC PROTEIN,UA negative negative, trace   Urobilinogen, UA 0.2 0.2 or 1.0 E.U./dL   Nitrite, UA Positive (A) Negative   Leukocytes, UA Trace (A) Negative      Assessment & Plan:  1. Lower abdominal pain (Primary) Urine pregnancy negative. Urinalysis positive for UTI. Urine sent for culture. Macrobid  100mg  BID x 5 days sent to pharmacy. Labs ordered today to check liver enzymes and pancreas d/t pt's history of alcohol use.  - POCT urine pregnancy - Urine cytology ancillary only - Urine Culture - Comprehensive Metabolic Panel (CMET) - CBC with Differential - Lipase - POCT URINALYSIS DIP (CLINITEK)  2. Primary hypertension Repeat blood pressure today was also elevated. Amlodipine -olmesartan  5-20mg  sent to pharmacy. Pt advised to take this medication daily and return in 2 weeks for nurse BP check, and 6 weeks for PCP HTN recheck.    Meds ordered this encounter  Medications   amLODipine -olmesartan  (AZOR ) 5-20 MG tablet    Sig: Take 1 tablet by mouth daily.     Dispense:  30 tablet    Refill:  3    Supervising Provider:   DE PERU, RAYMOND J [[id number]]   nitrofurantoin , macrocrystal-monohydrate, (MACROBID ) 100 MG capsule    Sig: Take 1 capsule (100 mg total) by mouth 2 (two) times daily.    Dispense:  10 capsule    Refill:  0    Supervising Provider:   DE PERU, RAYMOND J [[id number]]   Lab Orders         Urine Culture         Comprehensive Metabolic Panel (CMET)         CBC with Differential         Lipase         POCT urine pregnancy         POCT URINALYSIS DIP (CLINITEK)     No images are attached to the encounter or orders placed in the encounter.  Return for 2 weeks BP Check only; 6 weeks HYPERTENSION Office Visit .    Patient to reach out to office if new, worrisome, or unresolved symptoms arise or if no improvement in patient's condition. Patient verbalized understanding and  is agreeable to treatment plan. All questions answered to patient's satisfaction.   Treatment plan and recommendation(s) reviewed by supervising preceptor, Thersia CLEMENTEEN Stark, FNP-C, prior to clinic discharge.    Rosina Ada, BSN, RN DNP Student

## 2024-01-17 NOTE — Progress Notes (Signed)
 Acute Care Office Visit  Subjective:   Tammy Colon [birth year] 01/17/2024  Chief Complaint  Patient presents with   Abdominal Pain    Pt has been having right lower abdominal pain which began about 1 week ago.    HPI: ABDOMINAL PAIN: Onset: 2 weeks ago  Location: diffuse lower abdomen Description of pain: sharp Radiation: denies  Severity: 0/10 at this time, 10/10 at its worst  Alleviating factors: denies Aggravating factors: denies Treatments tried: BC powder  Fever: no Nausea: yes - after consuming alcoholic beverage  Vomiting: no  Weight loss: no Decreased appetite: no Diarrhea: no Constipation: no Blood in stool: no Heartburn: no Dysuria/urinary frequency: no Hematuria: no History of sexually transmitted disease: no Recurrent NSAID use: no  Last unprotected sexual intercourse was 1day ago. She reports mild vaginal discharge.  She consumes alcohol 3 days a week per pt. She states she consumes 1 mixed drink per sitting every 2 days and possibly increases up to 4 drinks per sitting on the weekend. She has noticed her stomach becoming more distended over the past 2 weeks. Denies nausea/vomiting with other foods or beverages.   BLOOD PRESSURE: Pt is prescribed Amlodipine  2.5mg  for blood pressure control. Pt's BP is noted to be elevated today, states she doesn't take her medication on a regular basis. She has not taken her BP medication for the past 3-4 months.  She denies blurry vision, headaches, chest pain, palpitations, SOB.   The following portions of the patient's history were reviewed and updated as appropriate: past medical history, past surgical history, family history, social history, allergies, medications, and problem list.   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   Past Medical History:  Diagnosis Date   Abnormal cervical Papanicolaou smear 12/22/2021   Abnormal uterine  bleeding    Anemia    Anxiety    GERD (gastroesophageal reflux disease)    no meds   Hormone disorder    Irregular menses 04/06/2011   Migraine with aura    blurred vision   Primary insomnia 01/26/2023   Past Surgical History:  Procedure Laterality Date   ABDOMINAL HYSTERECTOMY     CYSTOSCOPY N/A 09/20/2016   Procedure: CYSTOSCOPY possible;  Surgeon: Kate Hargis Nearing, MD;  Location: [facility];  Service: Gynecology;  Laterality: N/A;  possible cysto   hysteroscopic myomectomy     LAPAROSCOPIC BILATERAL SALPINGECTOMY Bilateral 09/20/2016   Procedure: LAPAROSCOPIC BILATERAL SALPINGECTOMY;  Surgeon: Kate Hargis Nearing, MD;  Location: [facility];  Service: Gynecology;  Laterality: Bilateral;   LAPAROSCOPIC HYSTERECTOMY N/A 09/20/2016   Procedure: HYSTERECTOMY TOTAL LAPAROSCOPIC;  Surgeon: Kate Hargis Nearing, MD;  Location: [facility];  Service: Gynecology;  Laterality: N/A;   TUBAL LIGATION     Family History  Problem Relation Age of Onset   Lupus Maternal Aunt    Diabetes Daughter    Outpatient Medications Prior to Visit  Medication Sig Dispense Refill   albuterol  (VENTOLIN  HFA) 108 (90 Base) MCG/ACT inhaler Inhale 2 puffs into the lungs every 6 (six) hours as needed for wheezing or shortness of breath. 8 g 2   amLODipine  (NORVASC ) 2.5 MG tablet Take 1 tablet (2.5 mg total) by mouth daily. 90 tablet 1   No facility-administered medications prior to visit.   No Known Allergies   ROS: A complete ROS was performed with pertinent positives/negatives noted in the HPI. The remainder of the ROS are negative.  Objective:   Today's Vitals   01/17/24 1535 01/17/24 1602  BP: (!) 157/103 (!) 160/98  Pulse: (!) 57   SpO2: 100%   Weight: 140 lb (63.5 kg)   Height: 5' 4 (1.626 m)     GENERAL: Well-appearing, in NAD. Well nourished.  SKIN: Pink, warm and dry. No rash, lesion, ulceration, or ecchymoses.  Head: Normocephalic. NECK: Trachea midline. Full ROM w/o pain or tenderness.   EYES: Conjunctiva clear without exudates. EOMI, PERRL.  THROAT: Mucous membranes pink and moist.  RESPIRATORY: Chest wall symmetrical. Respirations even and non-labored. Breath sounds clear to auscultation bilaterally.  CARDIAC: S1, S2 present, regular rate and rhythm without murmur or gallops. Peripheral pulses 2+ bilaterally.  MSK: Muscle tone and strength appropriate for age. Joints w/o tenderness, redness, or swelling.  EXTREMITIES: Without clubbing, cyanosis, or edema.  GI: Abdomen soft. Normoactive bowel sounds. No rebound tenderness. No hepatomegaly or splenomegaly. No CVA tenderness. Mild tenderness with palpation diffuse lower abdomen. Mild distention. No ascites or rebound tenderness.   NEUROLOGIC: No motor or sensory deficits. Steady, even gait. C2-C12 intact.  PSYCH/MENTAL STATUS: Alert, oriented x 3. Cooperative, appropriate mood and affect.    Results for orders placed or performed in visit on 01/17/24  POCT urine pregnancy  Result Value Ref Range   Preg Test, Ur Negative Negative  POCT URINALYSIS DIP (CLINITEK)  Result Value Ref Range   Color, UA yellow yellow   Clarity, UA clear clear   Glucose, UA negative negative mg/dL   Bilirubin, UA negative negative   Ketones, POC UA negative negative mg/dL   Spec Grav, UA 8.979 8.989 - 1.025   Blood, UA trace-intact (A) negative   pH, UA 6.0 5.0 - 8.0   POC PROTEIN,UA negative negative, trace   Urobilinogen, UA 0.2 0.2 or 1.0 E.U./dL   Nitrite, UA Positive (A) Negative   Leukocytes, UA Trace (A) Negative      Assessment & Plan:  1. Lower abdominal pain (Primary) Urine pregnancy negative. Urinalysis positive for UTI. Urine sent for culture. Macrobid  100mg  BID x 5 days sent to pharmacy. Labs ordered today to check liver enzymes and pancreas d/t pt's history of alcohol use and concern for abdominal distention. Pending labs, may order imaging for further evaluation.  - POCT urine pregnancy - Urine cytology ancillary only -  Urine Culture - Comprehensive Metabolic Panel (CMET) - CBC with Differential - Lipase - POCT URINALYSIS DIP (CLINITEK)  2. Primary hypertension Uncontrolled. Repeat blood pressure today was also elevated. Amlodipine -olmesartan  5-20mg  sent to pharmacy. Pt advised to take this medication daily and return in 2 weeks for nurse BP check, and 6 weeks for PCP HTN recheck.    Meds ordered this encounter  Medications   amLODipine -olmesartan  (AZOR ) 5-20 MG tablet    Sig: Take 1 tablet by mouth daily.    Dispense:  30 tablet    Refill:  3    Supervising Provider:   DE PERU, RAYMOND J [[id number]]   nitrofurantoin , macrocrystal-monohydrate, (MACROBID ) 100 MG capsule    Sig: Take 1 capsule (100 mg total) by mouth 2 (two) times daily.    Dispense:  10 capsule    Refill:  0    Supervising Provider:   DE PERU, RAYMOND J [[id number]]   Lab Orders         Urine Culture         Comprehensive Metabolic Panel (CMET)         CBC with Differential  Lipase         POCT urine pregnancy         POCT URINALYSIS DIP (CLINITEK)     No images are attached to the encounter or orders placed in the encounter.  Return for 2 weeks BP Check only; 6 weeks HYPERTENSION Office Visit .    Patient to reach out to office if new, worrisome, or unresolved symptoms arise or if no improvement in patient's condition. Patient verbalized understanding and is agreeable to treatment plan. All questions answered to patient's satisfaction.   Thersia Stark, FNP-C

## 2024-01-18 ENCOUNTER — Ambulatory Visit (HOSPITAL_BASED_OUTPATIENT_CLINIC_OR_DEPARTMENT_OTHER): Payer: Self-pay | Admitting: Family Medicine

## 2024-01-18 LAB — URINE CYTOLOGY ANCILLARY ONLY
Chlamydia: NEGATIVE
Comment: NEGATIVE
Comment: NEGATIVE
Comment: NORMAL
Neisseria Gonorrhea: NEGATIVE
Trichomonas: NEGATIVE

## 2024-01-18 LAB — COMPREHENSIVE METABOLIC PANEL WITH GFR
ALT: 12 IU/L (ref 0–32)
AST: 17 IU/L (ref 0–40)
Albumin: 4.3 g/dL (ref 3.9–4.9)
Alkaline Phosphatase: 55 IU/L (ref 44–121)
BUN/Creatinine Ratio: 10 (ref 9–23)
BUN: 10 mg/dL (ref 6–24)
Bilirubin Total: 0.3 mg/dL (ref 0.0–1.2)
CO2: 23 mmol/L (ref 20–29)
Calcium: 10.2 mg/dL (ref 8.7–10.2)
Chloride: 106 mmol/L (ref 96–106)
Creatinine, Ser: 0.98 mg/dL (ref 0.57–1.00)
Globulin, Total: 2.8 g/dL (ref 1.5–4.5)
Glucose: 91 mg/dL (ref 70–99)
Potassium: 3.8 mmol/L (ref 3.5–5.2)
Sodium: 145 mmol/L — ABNORMAL HIGH (ref 134–144)
Total Protein: 7.1 g/dL (ref 6.0–8.5)
eGFR: 70 mL/min/1.73 (ref >59)

## 2024-01-18 LAB — CBC WITH DIFFERENTIAL/PLATELET
Basophils Absolute: 0 x10E3/uL (ref 0.0–0.2)
Basos: 1 %
EOS (ABSOLUTE): 0.1 x10E3/uL (ref 0.0–0.4)
Eos: 3 %
Hematocrit: 36.9 % (ref 34.0–46.6)
Hemoglobin: 12.4 g/dL (ref 11.1–15.9)
Immature Grans (Abs): 0 x10E3/uL (ref 0.0–0.1)
Immature Granulocytes: 0 %
Lymphocytes Absolute: 1.5 x10E3/uL (ref 0.7–3.1)
Lymphs: 33 %
MCH: 31.7 pg (ref 26.6–33.0)
MCHC: 33.6 g/dL (ref 31.5–35.7)
MCV: 94 fL (ref 79–97)
Monocytes Absolute: 0.4 x10E3/uL (ref 0.1–0.9)
Monocytes: 8 %
Neutrophils Absolute: 2.4 x10E3/uL (ref 1.4–7.0)
Neutrophils: 55 %
Platelets: 342 x10E3/uL (ref 150–450)
RBC: 3.91 x10E6/uL (ref 3.77–5.28)
RDW: 11.9 % (ref 11.7–15.4)
WBC: 4.5 x10E3/uL (ref 3.4–10.8)

## 2024-01-18 LAB — LIPASE: Lipase: 67 U/L (ref 14–72)

## 2024-01-18 NOTE — Progress Notes (Signed)
 Hi Tammy Colon, Your electrolytes and liver function were stable.  Your kidney function has decreased in the past year and may be due to the uncontrolled blood pressure as this does exert significant effect on your kidneys.  Please start taking the medication as directed and increase your clear fluids.  Please decrease alcohol use.  Given your ongoing distention, we could obtain a ultrasound of the liver and abdomen to see if this is contributing.  Please let me know if he would like to proceed with this.

## 2024-01-18 NOTE — Progress Notes (Signed)
STD testing is negative

## 2024-01-20 LAB — URINE CULTURE

## 2024-01-23 NOTE — Progress Notes (Signed)
 Urine culture indicates sensitivity to Macrobid . No change to course of treatment needed.

## 2024-01-30 ENCOUNTER — Encounter (HOSPITAL_BASED_OUTPATIENT_CLINIC_OR_DEPARTMENT_OTHER): Payer: Self-pay | Admitting: Family Medicine

## 2024-02-10 ENCOUNTER — Ambulatory Visit (HOSPITAL_BASED_OUTPATIENT_CLINIC_OR_DEPARTMENT_OTHER): Payer: Self-pay | Admitting: Family Medicine

## 2024-02-14 ENCOUNTER — Ambulatory Visit (HOSPITAL_BASED_OUTPATIENT_CLINIC_OR_DEPARTMENT_OTHER): Payer: Self-pay | Admitting: Family Medicine
